# Patient Record
Sex: Female | Born: 1990 | Race: Black or African American | Hispanic: No | Marital: Single | Smoking: Former smoker
Health system: Southern US, Community
[De-identification: ages and names within clinical notes are randomized; demographics above are authoritative.]

## PROBLEM LIST (undated history)

## (undated) ENCOUNTER — Inpatient Hospital Stay (HOSPITAL_COMMUNITY): Payer: Self-pay

## (undated) DIAGNOSIS — O133 Gestational [pregnancy-induced] hypertension without significant proteinuria, third trimester: Secondary | ICD-10-CM

## (undated) DIAGNOSIS — O139 Gestational [pregnancy-induced] hypertension without significant proteinuria, unspecified trimester: Secondary | ICD-10-CM

## (undated) DIAGNOSIS — O163 Unspecified maternal hypertension, third trimester: Secondary | ICD-10-CM

## (undated) DIAGNOSIS — R079 Chest pain, unspecified: Secondary | ICD-10-CM

## (undated) DIAGNOSIS — R011 Cardiac murmur, unspecified: Secondary | ICD-10-CM

## (undated) HISTORY — DX: Chest pain, unspecified: R07.9

## (undated) HISTORY — DX: Cardiac murmur, unspecified: R01.1

## (undated) HISTORY — PX: INDUCED ABORTION: SHX677

---
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
## 2008-06-09 ENCOUNTER — Inpatient Hospital Stay (HOSPITAL_COMMUNITY): Admission: AD | Admit: 2008-06-09 | Discharge: 2008-06-09 | Payer: Self-pay | Admitting: Obstetrics & Gynecology

## 2008-07-04 ENCOUNTER — Inpatient Hospital Stay (HOSPITAL_COMMUNITY): Admission: AD | Admit: 2008-07-04 | Discharge: 2008-07-04 | Payer: Self-pay | Admitting: Family Medicine

## 2008-08-06 ENCOUNTER — Ambulatory Visit (HOSPITAL_COMMUNITY): Admission: RE | Admit: 2008-08-06 | Discharge: 2008-08-06 | Payer: Self-pay | Admitting: Obstetrics & Gynecology

## 2008-08-09 ENCOUNTER — Inpatient Hospital Stay (HOSPITAL_COMMUNITY): Admission: AD | Admit: 2008-08-09 | Discharge: 2008-08-09 | Payer: Self-pay | Admitting: Obstetrics & Gynecology

## 2008-11-18 ENCOUNTER — Inpatient Hospital Stay (HOSPITAL_COMMUNITY): Admission: AD | Admit: 2008-11-18 | Discharge: 2008-11-18 | Payer: Self-pay | Admitting: Obstetrics & Gynecology

## 2009-01-03 ENCOUNTER — Inpatient Hospital Stay (HOSPITAL_COMMUNITY): Admission: AD | Admit: 2009-01-03 | Discharge: 2009-01-05 | Payer: Self-pay | Admitting: Obstetrics

## 2009-09-09 IMAGING — US US OB COMP LESS 14 WK
1 series · 14 of 28 positions shown · non-contrast
Comparison: none

OBSTETRICAL ULTRASOUND:
 This ultrasound exam was performed in the [HOSPITAL] Ultrasound Department.  The OB US report was generated in the AS system, and faxed to the ordering physician.  This report is also available in [REDACTED] PACS.

[Series 1: us ob comp less 14 wk · 0.25mm/px · 14 of 56 slices shown]
[im 3/56]
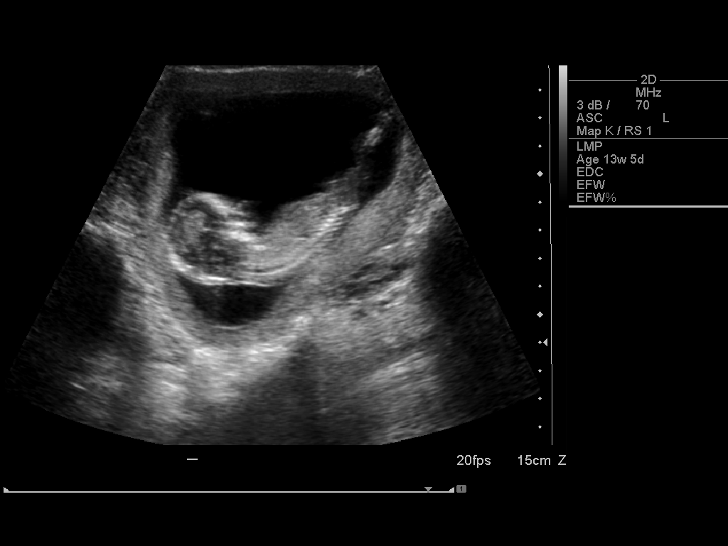
[im 7/56]
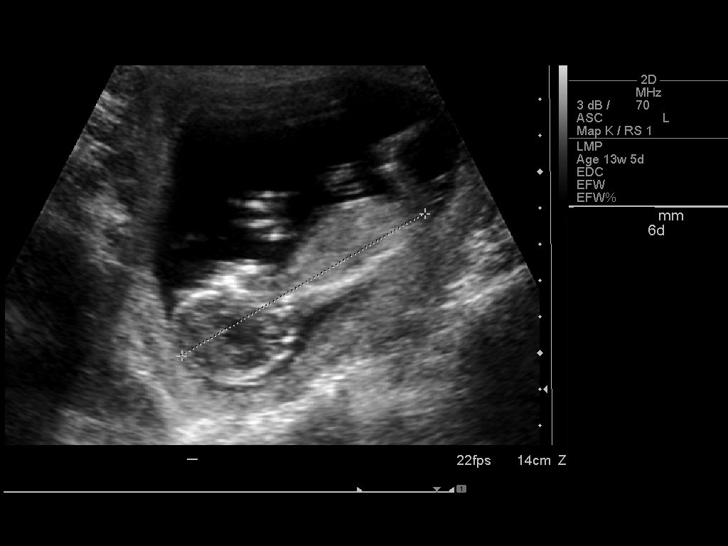
[im 11/56]
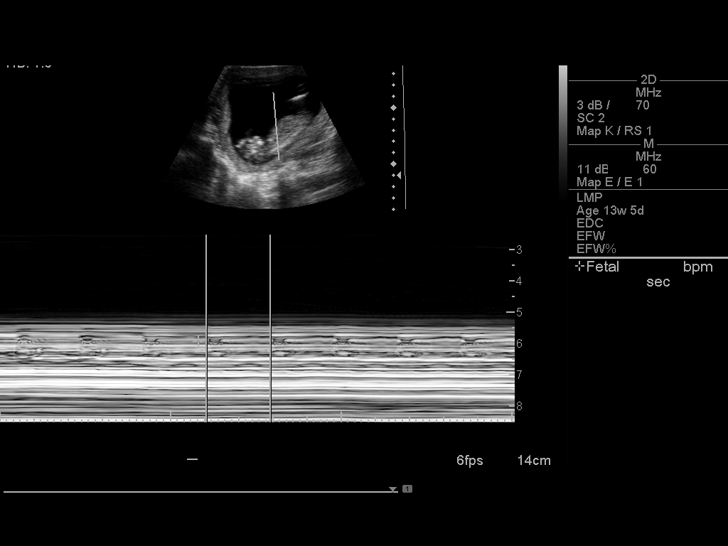
[im 15/56]
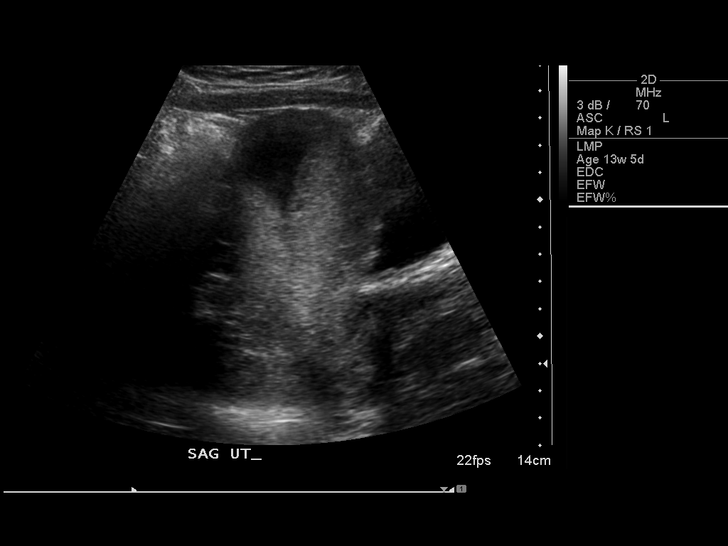
[im 19/56]
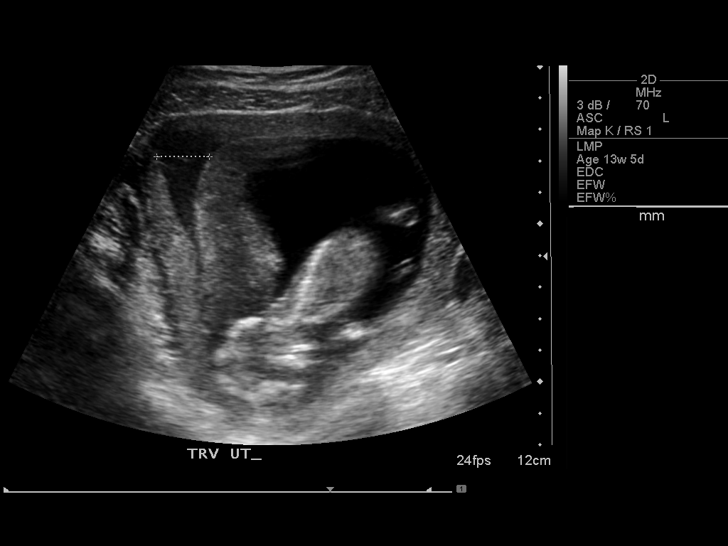
[im 23/56]
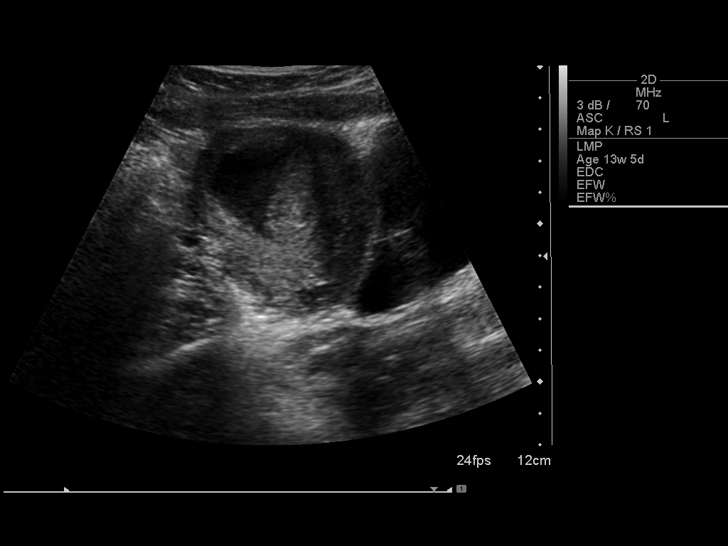
[im 27/56]
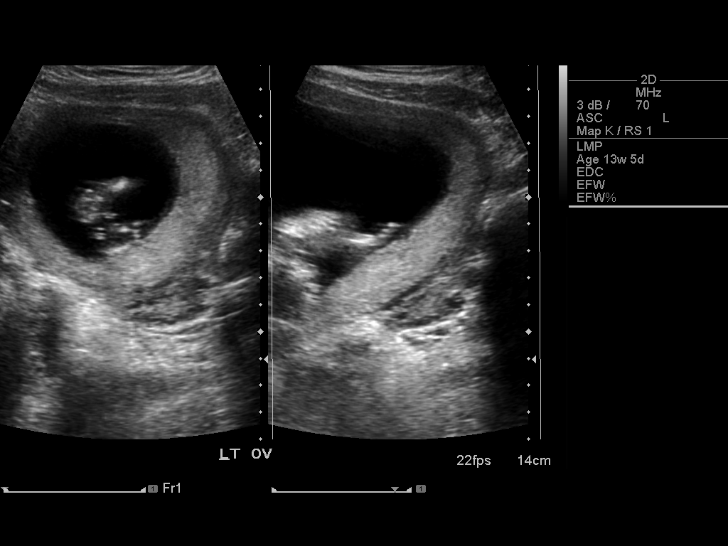
[im 31/56]
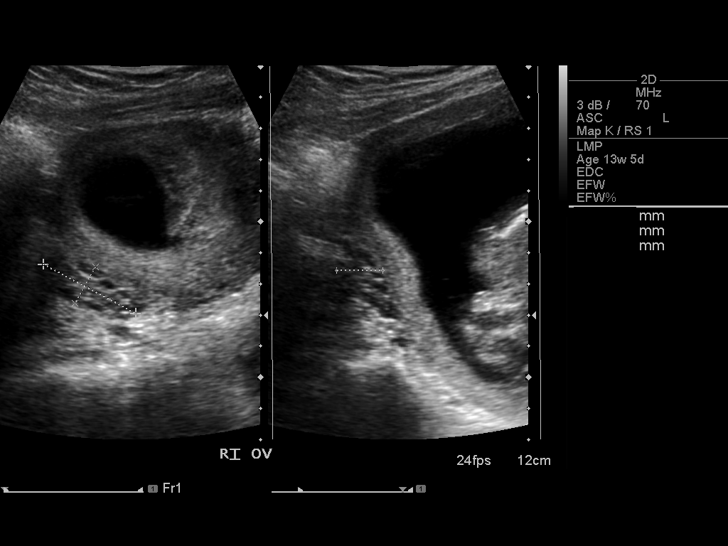
[im 35/56]
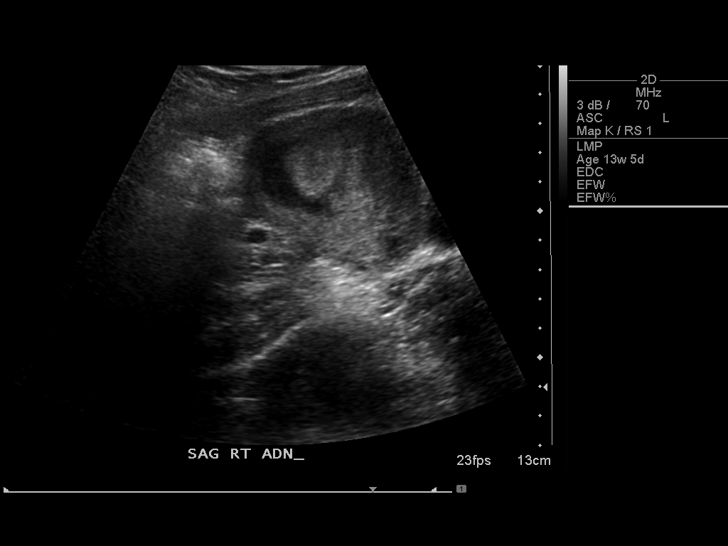
[im 39/56]
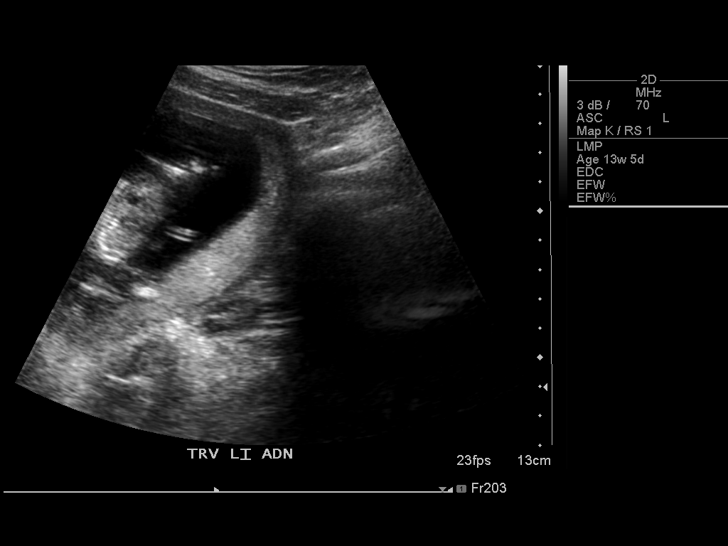
[im 43/56]
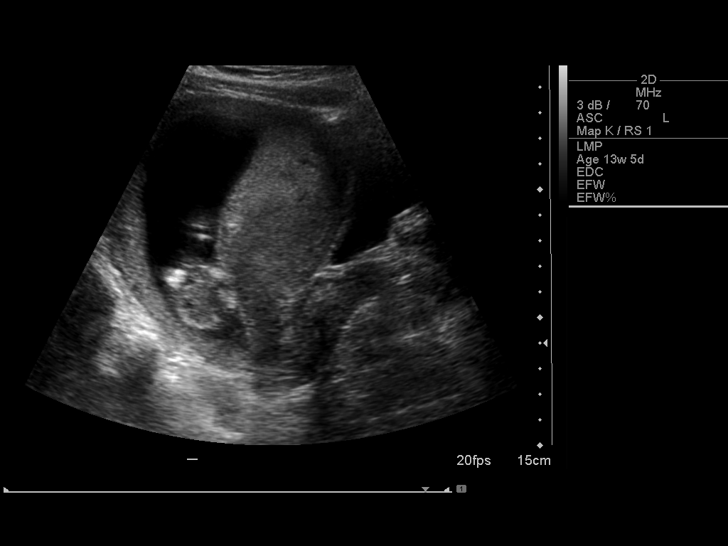
[im 47/56]
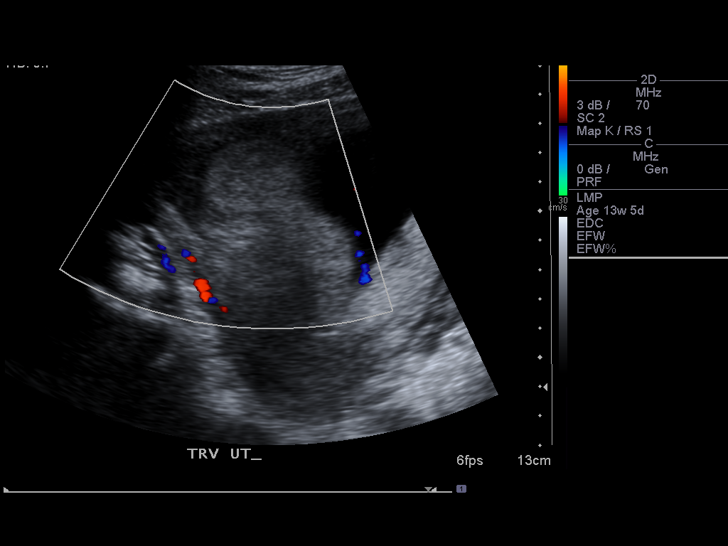
[im 51/56]
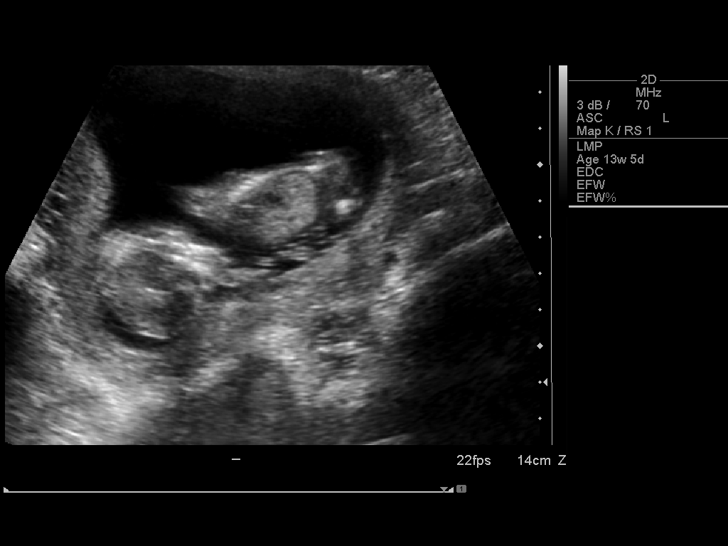
[im 56/56]
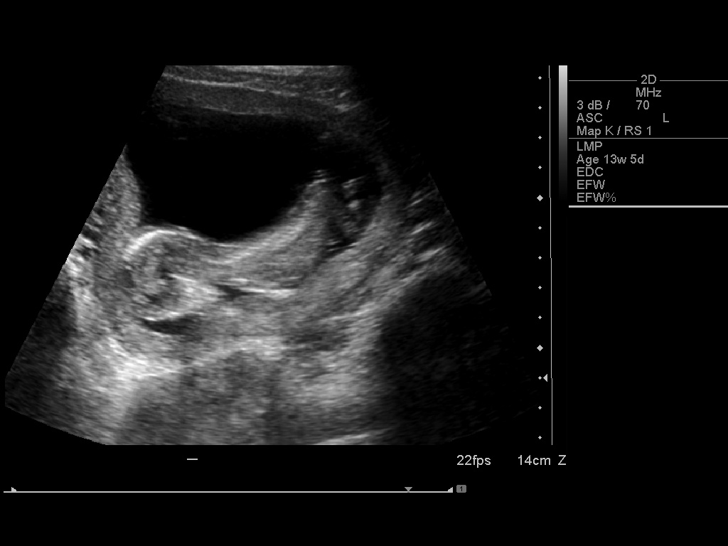

[14 of 28 positions shown; findings below may reference images not displayed]

IMPRESSION: See [facility] Obstetric US report.

## 2010-12-28 ENCOUNTER — Ambulatory Visit (HOSPITAL_COMMUNITY): Admission: RE | Admit: 2010-12-28 | Payer: Self-pay | Source: Home / Self Care | Admitting: Obstetrics & Gynecology

## 2011-02-25 ENCOUNTER — Inpatient Hospital Stay (HOSPITAL_COMMUNITY)
Admission: AD | Admit: 2011-02-25 | Discharge: 2011-02-25 | Disposition: A | Discharge status: Home / Self Care | Payer: 59 | Source: Ambulatory Visit | Attending: Obstetrics | Admitting: Obstetrics

## 2011-02-25 ENCOUNTER — Inpatient Hospital Stay (HOSPITAL_COMMUNITY): Payer: 59

## 2011-02-25 DIAGNOSIS — O98819 Other maternal infectious and parasitic diseases complicating pregnancy, unspecified trimester: Secondary | ICD-10-CM

## 2011-02-25 DIAGNOSIS — O4690 Antepartum hemorrhage, unspecified, unspecified trimester: Secondary | ICD-10-CM | POA: Insufficient documentation

## 2011-02-25 DIAGNOSIS — A5901 Trichomonal vulvovaginitis: Secondary | ICD-10-CM | POA: Insufficient documentation

## 2011-02-25 LAB — WET PREP, GENITAL: Clue Cells Wet Prep HPF POC: NONE SEEN

## 2011-02-25 LAB — CBC
HCT: 25.2 % — ABNORMAL LOW (ref 36.0–46.0)
MCV: 83.7 fL (ref 78.0–100.0)
Platelets: 248 10*3/uL (ref 150–400)
RBC: 3.01 MIL/uL — ABNORMAL LOW (ref 3.87–5.11)
WBC: 12.6 10*3/uL — ABNORMAL HIGH (ref 4.0–10.5)

## 2011-02-27 LAB — GC/CHLAMYDIA PROBE AMP, GENITAL: Chlamydia, DNA Probe: NEGATIVE

## 2011-03-20 LAB — CBC
HCT: 30.9 % — ABNORMAL LOW (ref 36.0–46.0)
MCHC: 33.3 g/dL (ref 30.0–36.0)
MCHC: 34 g/dL (ref 30.0–36.0)
MCV: 89.3 fL (ref 78.0–100.0)
Platelets: 201 10*3/uL (ref 150–400)
Platelets: 237 10*3/uL (ref 150–400)
RBC: 2.8 MIL/uL — ABNORMAL LOW (ref 3.87–5.11)
RDW: 13.6 % (ref 11.5–15.5)
RDW: 13.9 % (ref 11.5–15.5)

## 2011-04-04 ENCOUNTER — Inpatient Hospital Stay (HOSPITAL_COMMUNITY)
Admission: AD | Admit: 2011-04-04 | Discharge: 2011-04-05 | Disposition: A | Discharge status: Home / Self Care | Payer: 59 | Source: Ambulatory Visit | Attending: Obstetrics & Gynecology | Admitting: Obstetrics & Gynecology

## 2011-04-04 DIAGNOSIS — O479 False labor, unspecified: Secondary | ICD-10-CM | POA: Insufficient documentation

## 2011-04-07 ENCOUNTER — Inpatient Hospital Stay (HOSPITAL_COMMUNITY)
Admission: AD | Admit: 2011-04-07 | Discharge: 2011-04-10 | DRG: 775 | Disposition: A | Discharge status: Home / Self Care | Payer: 59 | Source: Ambulatory Visit | Attending: Obstetrics & Gynecology | Admitting: Obstetrics & Gynecology

## 2011-04-07 DIAGNOSIS — Z22330 Carrier of Group B streptococcus: Secondary | ICD-10-CM

## 2011-04-07 DIAGNOSIS — O99892 Other specified diseases and conditions complicating childbirth: Secondary | ICD-10-CM | POA: Diagnosis present

## 2011-04-07 DIAGNOSIS — O4290 Premature rupture of membranes, unspecified as to length of time between rupture and onset of labor, unspecified weeks of gestation: Principal | ICD-10-CM | POA: Diagnosis present

## 2011-04-07 LAB — CBC
MCV: 79.5 fL (ref 78.0–100.0)
Platelets: 197 10*3/uL (ref 150–400)
RBC: 3.75 MIL/uL — ABNORMAL LOW (ref 3.87–5.11)
RDW: 15.4 % (ref 11.5–15.5)
WBC: 14.6 10*3/uL — ABNORMAL HIGH (ref 4.0–10.5)

## 2011-04-08 LAB — RPR: RPR Ser Ql: NONREACTIVE

## 2011-04-09 LAB — CBC
Hemoglobin: 8.3 g/dL — ABNORMAL LOW (ref 12.0–15.0)
Platelets: 186 10*3/uL (ref 150–400)
RBC: 3.29 MIL/uL — ABNORMAL LOW (ref 3.87–5.11)
WBC: 17.4 10*3/uL — ABNORMAL HIGH (ref 4.0–10.5)

## 2011-08-30 LAB — URINALYSIS, ROUTINE W REFLEX MICROSCOPIC
Hgb urine dipstick: NEGATIVE
Nitrite: NEGATIVE
Protein, ur: NEGATIVE
Specific Gravity, Urine: 1.01
Urobilinogen, UA: 1

## 2011-08-30 LAB — POCT PREGNANCY, URINE
Operator id: 118971
Preg Test, Ur: POSITIVE

## 2011-08-30 LAB — URINE MICROSCOPIC-ADD ON

## 2011-08-30 LAB — WET PREP, GENITAL
Clue Cells Wet Prep HPF POC: NONE SEEN
Yeast Wet Prep HPF POC: NONE SEEN

## 2011-08-30 LAB — GC/CHLAMYDIA PROBE AMP, GENITAL: Chlamydia, DNA Probe: NEGATIVE

## 2011-08-31 LAB — URINE CULTURE: Colony Count: NO GROWTH

## 2011-08-31 LAB — URINALYSIS, ROUTINE W REFLEX MICROSCOPIC
Bilirubin Urine: NEGATIVE
Ketones, ur: NEGATIVE
Nitrite: NEGATIVE
Specific Gravity, Urine: 1.01
Urobilinogen, UA: 0.2
pH: 7.5

## 2011-09-05 LAB — BASIC METABOLIC PANEL
Calcium: 9.5
Creatinine, Ser: 0.39 — ABNORMAL LOW
GFR calc Af Amer: >60
GFR calc non Af Amer: >60
Sodium: 134 — ABNORMAL LOW

## 2011-09-05 LAB — CBC
Hemoglobin: 10.9 — ABNORMAL LOW
RBC: 3.66 — ABNORMAL LOW
WBC: 11.8 — ABNORMAL HIGH

## 2011-09-07 LAB — URINE MICROSCOPIC-ADD ON

## 2011-09-07 LAB — URINALYSIS, ROUTINE W REFLEX MICROSCOPIC
Glucose, UA: NEGATIVE mg/dL
Protein, ur: NEGATIVE mg/dL
Specific Gravity, Urine: <1.005 — ABNORMAL LOW (ref 1.005–1.030)
pH: 7 (ref 5.0–8.0)

## 2011-09-07 LAB — GC/CHLAMYDIA PROBE AMP, GENITAL
Chlamydia, DNA Probe: NEGATIVE
GC Probe Amp, Genital: NEGATIVE

## 2011-09-07 LAB — WET PREP, GENITAL: Clue Cells Wet Prep HPF POC: NONE SEEN

## 2013-03-04 ENCOUNTER — Ambulatory Visit: Payer: 59

## 2013-03-04 ENCOUNTER — Ambulatory Visit (INDEPENDENT_AMBULATORY_CARE_PROVIDER_SITE_OTHER): Payer: 59 | Admitting: Family Medicine

## 2013-03-04 VITALS — BP 130/87 | HR 105 | Temp 99.4°F | Resp 16 | Ht 64.0 in | Wt 113.2 lb

## 2013-03-04 DIAGNOSIS — M25532 Pain in left wrist: Secondary | ICD-10-CM

## 2013-03-04 DIAGNOSIS — M79642 Pain in left hand: Secondary | ICD-10-CM

## 2013-03-04 DIAGNOSIS — M25539 Pain in unspecified wrist: Secondary | ICD-10-CM

## 2013-03-04 DIAGNOSIS — S63502A Unspecified sprain of left wrist, initial encounter: Secondary | ICD-10-CM

## 2013-03-04 DIAGNOSIS — M79609 Pain in unspecified limb: Secondary | ICD-10-CM

## 2013-03-04 DIAGNOSIS — S63509A Unspecified sprain of unspecified wrist, initial encounter: Secondary | ICD-10-CM
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
## 2013-03-04 NOTE — Patient Instructions (Addendum)
Wear the splint as discussed. Ice to area up to 15 minutes at a time as needed. Ibuprofen over the counter is ok to use, but call if stronger medicine needed. Recheck next Thursday 03/12/13 with Dr. Neva Seat after 4pm.  See the handout below, but can wait on actual exercises until recheck. Return to the clinic or go to the nearest emergency room if any of your symptoms worsen or new symptoms occur.  Here is the number for [facility] of the [place] for counseling if you would like. Main number: [phone] 24 hour crisis line: [phone]   Wrist Sprain with Rehab A sprain is an injury in which a ligament that maintains the proper alignment of a joint is partially or completely torn. The ligaments of the wrist are susceptible to sprains. Sprains are classified into three categories. Grade 1 sprains cause pain, but the tendon is not lengthened. Grade 2 sprains include a lengthened ligament because the ligament is stretched or partially ruptured. With grade 2 sprains there is still function, although the function may be diminished. Grade 3 sprains are characterized by a complete tear of the tendon or muscle, and function is usually impaired. SYMPTOMS   Pain tenderness, inflammation, and/or bruising (contusion) of the injury.  A "pop" or tear felt and/or heard at the time of injury.  Decreased wrist function. CAUSES  A wrist sprain occurs when a force is placed on one or more ligaments that is greater than it/they can withstand. Common mechanisms of injury include:  Catching a ball with you hands.  Repetitive and/ or strenuous extension or flexion of the wrist. RISK INCREASES WITH:  Previous wrist injury.  Contact sports (boxing or wrestling).  Activities in which falling is common.  Poor strength and flexibility.  Improperly fitted or padded protective equipment. PREVENTION  Warm up and stretch properly before activity.  Allow for adequate recovery between workouts.  Maintain  physical fitness:  Strength, flexibility, and endurance.  Cardiovascular fitness.  Protect the wrist joint by limiting its motion with the use of taping, braces, or splints.  Protect the wrist after injury for 6 to 12 months. PROGNOSIS  The prognosis for wrist sprains depends on the degree of injury. Grade 1 sprains require 2 to 6 weeks of treatment. Grade 2 sprains require 6 to 8 weeks of treatment, and grade 3 sprains require up to 12 weeks.  RELATED COMPLICATIONS   Prolonged healing time, if improperly treated or re-injured.  Recurrent symptoms that result in a chronic problem.  Injury to nearby structures (bone, cartilage, nerves, or tendons).  Arthritis of the wrist.  Inability to compete in athletics at a high level.  Wrist stiffness or weakness.  Progression to a complete rupture of the ligament. TREATMENT  Treatment initially involves resting from any activities that aggravate the symptoms, and the use of ice and medications to help reduce pain and inflammation. Your caregiver may recommend immobilizing the wrist for a period of time in order to reduce stress on the ligament and allow for healing. After immobilization it is important to perform strengthening and stretching exercises to help regain strength and a full range of motion. These exercises may be completed at home or with a therapist. Surgery is not usually required for wrist sprains, unless the ligament has been ruptured (grade 3 sprain). MEDICATION   If pain medication is necessary, then nonsteroidal anti-inflammatory medications, such as aspirin and ibuprofen, or other minor pain relievers, such as acetaminophen, are often recommended.  Do not take pain  medication for 7 days before surgery.  Prescription pain relievers may be given if deemed necessary by your caregiver. Use only as directed and only as much as you need. HEAT AND COLD  Cold treatment (icing) relieves pain and reduces inflammation. Cold  treatment should be applied for 10 to 15 minutes every 2 to 3 hours for inflammation and pain and immediately after any activity that aggravates your symptoms. Use ice packs or massage the area with a piece of ice (ice massage).  Heat treatment may be used prior to performing the stretching and strengthening activities prescribed by your caregiver, physical therapist, or athletic trainer. Use a heat pack or soak your injury in warm water. SEEK MEDICAL CARE IF:  Treatment seems to offer no benefit, or the condition worsens.  Any medications produce adverse side effects. EXERCISES RANGE OF MOTION (ROM) AND STRETCHING EXERCISES - Wrist Sprain  These exercises may help you when beginning to rehabilitate your injury. Your symptoms may resolve with or without further involvement from your physician, physical therapist or athletic trainer. While completing these exercises, remember:   Restoring tissue flexibility helps normal motion to return to the joints. This allows healthier, less painful movement and activity.  An effective stretch should be held for at least 30 seconds.  A stretch should never be painful. You should only feel a gentle lengthening or release in the stretched tissue. RANGE OF MOTION  Wrist Flexion, Active-Assisted  Extend your right / left elbow with your fingers pointing down.*  Gently pull the back of your hand towards you until you feel a gentle stretch on the top of your forearm.  Hold this position for __________ seconds. Repeat __________ times. Complete this exercise __________ times per day.  *If directed by your physician, physical therapist or athletic trainer, complete this stretch with your elbow bent rather than extended. RANGE OF MOTION  Wrist Extension, Active-Assisted  Extend your right / left elbow and turn your palm upwards.*  Gently pull your palm/fingertips back so your wrist extends and your fingers point more toward the ground.  You should feel a  gentle stretch on the inside of your forearm.  Hold this position for __________ seconds. Repeat __________ times. Complete this exercise __________ times per day. *If directed by your physician, physical therapist or athletic trainer, complete this stretch with your elbow bent, rather than extended. RANGE OF MOTION  Supination, Active  Stand or sit with your elbows at your side. Bend your right / left elbow to 90 degrees.  Turn your palm upward until you feel a gentle stretch on the inside of your forearm.  Hold this position for __________ seconds. Slowly release and return to the starting position. Repeat __________ times. Complete this stretch __________ times per day.  RANGE OF MOTION  Pronation, Active  Stand or sit with your elbows at your side. Bend your right / left elbow to 90 degrees.  Turn your palm downward until you feel a gentle stretch on the top of your forearm.  Hold this position for __________ seconds. Slowly release and return to the starting position. Repeat __________ times. Complete this stretch __________ times per day.  STRETCH - Wrist Flexion  Place the back of your right / left hand on a tabletop leaving your elbow slightly bent. Your fingers should point away from your body.  Gently press the back of your hand down onto the table by straightening your elbow. You should feel a stretch on the top of your forearm.  Hold this position for __________ seconds. Repeat __________ times. Complete this stretch __________ times per day.  STRETCH  Wrist Extension  Place your right / left fingertips on a tabletop leaving your elbow slightly bent. Your fingers should point backwards.  Gently press your fingers and palm down onto the table by straightening your elbow. You should feel a stretch on the inside of your forearm.  Hold this position for __________ seconds. Repeat __________ times. Complete this stretch __________ times per day.  STRENGTHENING EXERCISES -  Wrist Sprain These exercises may help you when beginning to rehabilitate your injury. They may resolve your symptoms with or without further involvement from your physician, physical therapist or athletic trainer. While completing these exercises, remember:   Muscles can gain both the endurance and the strength needed for everyday activities through controlled exercises.  Complete these exercises as instructed by your physician, physical therapist or athletic trainer. Progress with the resistance and repetition exercises only as your caregiver advises. STRENGTH Wrist Flexors  Sit with your right / left forearm palm-up and fully supported. Your elbow should be resting below the height of your shoulder. Allow your wrist to extend over the edge of the surface.  Loosely holding a __________ weight or a piece of rubber exercise band/tubing, slowly curl your hand up toward your forearm.  Hold this position for __________ seconds. Slowly lower the wrist back to the starting position in a controlled manner. Repeat __________ times. Complete this exercise __________ times per day.  STRENGTH  Wrist Extensors  Sit with your right / left forearm palm-down and fully supported. Your elbow should be resting below the height of your shoulder. Allow your wrist to extend over the edge of the surface.  Loosely holding a __________ weight or a piece of rubber exercise band/tubing, slowly curl your hand up toward your forearm.  Hold this position for __________ seconds. Slowly lower the wrist back to the starting position in a controlled manner. Repeat __________ times. Complete this exercise __________ times per day.  STRENGTH - Ulnar Deviators  Stand with a ____________________ weight in your right / left hand, or sit holding on to the rubber exercise band/tubing with your opposite arm supported.  Move your wrist so that your pinkie travels toward your forearm and your thumb moves away from your  forearm.  Hold this position for __________ seconds and then slowly lower the wrist back to the starting position. Repeat __________ times. Complete this exercise __________ times per day STRENGTH - Radial Deviators  Stand with a ____________________ weight in your  right / left hand, or sit holding on to the rubber exercise band/tubing with your arm supported.  Raise your hand upward in front of you or pull up on the rubber tubing.  Hold this position for __________ seconds and then slowly lower the wrist back to the starting position. Repeat __________ times. Complete this exercise __________ times per day. STRENGTH  Forearm Supinators  Sit with your right / left forearm supported on a table, keeping your elbow below shoulder height. Rest your hand over the edge, palm down.  Gently grip a hammer or a soup ladle.  Without moving your elbow, slowly turn your palm and hand upward to a "thumbs-up" position.  Hold this position for __________ seconds. Slowly return to the starting position. Repeat __________ times. Complete this exercise __________ times per day.  STRENGTH  Forearm Pronators  Sit with your right / left forearm supported on a table, keeping your elbow below  shoulder height. Rest your hand over the edge, palm up.  Gently grip a hammer or a soup ladle.  Without moving your elbow, slowly turn your palm and hand upward to a "thumbs-up" position.  Hold this position for __________ seconds. Slowly return to the starting position. Repeat __________ times. Complete this exercise __________ times per day.  STRENGTH - Grip  Grasp a tennis ball, a dense sponge, or a large, rolled sock in your hand.  Squeeze as hard as you can without increasing any pain.  Hold this position for __________ seconds. Release your grip slowly. Repeat __________ times. Complete this exercise __________ times per day.  Document Released: 11/19/2005 Document Revised: 02/11/2012 Document Reviewed:  03/03/2009 [facility] Patient Information 2013 [place], [place].

## 2013-06-03 ENCOUNTER — Ambulatory Visit: Payer: 59

## 2013-06-03 ENCOUNTER — Ambulatory Visit (INDEPENDENT_AMBULATORY_CARE_PROVIDER_SITE_OTHER): Payer: 59 | Admitting: Family Medicine

## 2013-06-03 VITALS — BP 124/72 | HR 78 | Temp 98.8°F | Resp 18 | Ht 63.5 in | Wt 111.0 lb

## 2013-06-03 DIAGNOSIS — R079 Chest pain, unspecified: Secondary | ICD-10-CM

## 2013-06-03 DIAGNOSIS — R000 Tachycardia, unspecified: Secondary | ICD-10-CM

## 2013-06-03 DIAGNOSIS — R0602 Shortness of breath: Secondary | ICD-10-CM

## 2013-06-03 LAB — POCT CBC
HCT, POC: 39.8 % (ref 37.7–47.9)
Lymph, poc: 3.9 — AB (ref 0.6–3.4)
MCH, POC: 29.6 pg (ref 27–31.2)
MCHC: 31.7 g/dL — AB (ref 31.8–35.4)
MCV: 93.6 fL (ref 80–97)
MID (cbc): 0.6 (ref 0–0.9)
POC LYMPH PERCENT: 35.7 %L (ref 10–50)
Platelet Count, POC: 229 10*3/uL (ref 142–424)
RDW, POC: 13 %
WBC: 10.9 10*3/uL — AB (ref 4.6–10.2)

## 2013-06-03 NOTE — Progress Notes (Signed)
[facility] [address] [place] [address] [phone]  Date:  06/03/2013   Name:  Debbie Wood   DOB:  04-13-1991   MRN:  [id number]  PCP:  No PCP Per Patient    Chief Complaint: Chest Pain   History of Present Illness:  Debbie Wood is a 22 y.o. very pleasant female patient who presents with the following:  For about 9 days she has noted "my heart beating out of my chest."  Her heartbeats feel too fast and too hard.   She does note that the left side of her chest hurts, and she has pain into her back on the left side as well She also has noted some SOB.  She has been waking up at night feeling "like I can't breathe" and will note that her heart is pounding.   She does not do any strenuous exercise.  However, she has not noted any correlation between exertion and her sx.    She generally has a cough- she is a smoker- this is not worse.   She smokes about 3 MJ cigarettes a day. This has not changed as of late. No other drugs, no cocaine. No history of DVT/ PE.  No unusual family cardiac history- her parents have HTN but not CAD.   No recent travel. No hemoptysis.  She takes tri- sprintec OCP.    She was noted to have a heart murmur in her teens, she had an echocardiogram and was then allowed unrestricted activity.  Echo 2004: SUMMARY - Overall left ventricular systolic function was normal. Left ventricular ejection fraction was estimated , range being 55 % to 65 %. There were no left ventricular regional wall motion abnormalities. - The pulmonary veins were grossly normal. - Mild peripheral pulmonic stenosis. Peak transpulmonic valve gradient was 16 mmHg. Mean transpulmonic valve gradient was 9 mmHg. IMPRESSIONS - Very mild peripheral pulmonic stenosis-pulmonary valve normal. Otherwise normal study. - Disposition: Recheck in 2 years. No restrictions. --------------------------------------------------------------- Prepared and Electronically  Authenticated by Elsie Stain M.D. Confirmed 30-Sep-2003 16:04:31  She never did have a recheck echo.  She was seen by a pediatric cardiologist here in [place]  She does admit that she has been very stressed lately because her 23 year old son has been recently diagnosed with possible autism.  He is still undergoing evaluation, and has a care plan in place.  She feels better now that there is a plan but is still quite apprehensive and worried about him.   There are no active problems to display for this patient.   Past Medical History  Diagnosis Date  . Heart murmur     History reviewed. No pertinent past surgical history.  History  Substance Use Topics  . Smoking status: Former Smoker    Types: Cigarettes    Quit date: 06/03/2009  . Smokeless tobacco: Not on file  . Alcohol Use: No    Family History  Problem Relation Age of Onset  . Hypertension Mother   . Hyperlipidemia Mother   . Hypertension Father     No Known Allergies  Medication list has been reviewed and updated.  Current Outpatient Prescriptions on File Prior to Visit  Medication Sig Dispense Refill  . norgestrel-ethinyl estradiol (LO/OVRAL,CRYSELLE) 0.3-30 MG-MCG tablet Take 1 tablet by mouth daily.       No current facility-administered medications on file prior to visit.    Review of Systems:  As per HPI- otherwise negative.   Physical  Examination: Filed Vitals:   06/03/13 1619  BP: 124/72  Pulse: 116  Temp: 98.8 F (37.1 C)  Resp: 18   Filed Vitals:   06/03/13 1619  Height: 5' 3.5" (1.613 m)  Weight: 111 lb (50.349 kg)   Body mass index is 19.35 kg/(m^2). Ideal Body Weight: Weight in (lb) to have BMI = 25: 143.1  GEN: WDWN, NAD, Non-toxic, A & O x 3. Looks well HEENT: Atraumatic, Normocephalic. Neck supple. No masses, No LAD. Ears and Nose: No external deformity. CV: sinus tachycardia, No G/R. No JVD. No thrill. No extra heart sounds. She does have a systolic murmur, 2/6 I am  easily able to reproduce her CP by pressing on her left chest wall and on there left trapezius muscle/ upper back. No redness, abscess or lesion.    PULM: CTA B, no wheezes, crackles, rhonchi. No retractions. No resp. distress. No accessory muscle use. ABD: S, NT, ND, +BS. No rebound. No HSM. EXTR: No c/c/e, no calf swelling or tenderness  NEURO Normal gait.  PSYCH: Normally interactive. Conversant. Not depressed or anxious appearing.  Calm demeanor.   [facility] reading (PRIMARY) by  Dr. Patsy Lager. CXR:  Negative CHEST - 2 VIEW  Comparison: None.  Findings: Lungs are clear. Heart size is normal. No pneumothorax or pleural fluid.  IMPRESSION: Negative chest.  Clinically significant discrepancy from primary report, if provided: None  EKG: normal sinus rhythm, rate in the 70's.   Results for orders placed in visit on 06/03/13  D-DIMER, QUANTITATIVE      Result Value Range   D-Dimer, Quant 0.42  0.00 - 0.48 ug/mL-FEU  POCT CBC      Result Value Range   WBC 10.9 (*) 4.6 - 10.2 K/uL   Lymph, poc 3.9 (*) 0.6 - 3.4   POC LYMPH PERCENT 35.7  10 - 50 %L   MID (cbc) 0.6  0 - 0.9   POC MID % 5.5  0 - 12 %M   POC Granulocyte 6.4  2 - 6.9   Granulocyte percent 58.8  37 - 80 %G   RBC 4.25  4.04 - 5.48 M/uL   Hemoglobin 12.6  12.2 - 16.2 g/dL   HCT, POC 54.0  98.1 - 47.9 %   MCV 93.6  80 - 97 fL   MCH, POC 29.6  27 - 31.2 pg   MCHC 31.7 (*) 31.8 - 35.4 g/dL   RDW, POC 19.1     Platelet Count, POC 229  142 - 424 K/uL   MPV 8.5  0 - 99.8 fL    Assessment and Plan: Chest pain - Plan: DG Chest 2 View, EKG 12-Lead  SOB (shortness of breath) - Plan: D-dimer, quantitative  Tachycardia - Plan: POCT CBC, TSH  Discussed in detail with Nkechi.  Certainly we need to rule- out a PE.  Discussed doing a stat D dimer vs having her go to the ER.  Explained that it is also possible that she could have a cardiac issue.  CAD would be unlikely given her age, but it is possible.  Also, it is possible that  her pulmonary stenosis has gotten worse or that she has some other cardiac condition.  She could have further cardiac work- up at the ED.  Offered to arrange an ED evaluation for her.  At this time she declined to go to the ED, but did agree to a D dimer.  Assuming this is normal plan to obtain a repeat echo in the next  few days (asap).    Called around 8:30 pm with negative D dimer result.  She is relieved, and will plan to go for her echo soon.  If any problems/ getting worse overnight she will go to the ER.   Signed Abbe Amsterdam, MD

## 2013-06-04 ENCOUNTER — Ambulatory Visit (HOSPITAL_COMMUNITY)
Admission: RE | Admit: 2013-06-04 | Discharge: 2013-06-04 | Disposition: A | Discharge status: Home / Self Care | Payer: 59 | Source: Ambulatory Visit | Attending: Cardiovascular Disease | Admitting: Cardiovascular Disease

## 2013-06-04 DIAGNOSIS — R000 Tachycardia, unspecified: Secondary | ICD-10-CM

## 2013-06-04 DIAGNOSIS — R079 Chest pain, unspecified: Secondary | ICD-10-CM | POA: Insufficient documentation

## 2013-06-04 DIAGNOSIS — I359 Nonrheumatic aortic valve disorder, unspecified: Secondary | ICD-10-CM | POA: Insufficient documentation

## 2013-06-04 DIAGNOSIS — I059 Rheumatic mitral valve disease, unspecified: Secondary | ICD-10-CM | POA: Insufficient documentation

## 2013-06-04 DIAGNOSIS — R0602 Shortness of breath: Secondary | ICD-10-CM

## 2013-06-04 DIAGNOSIS — I079 Rheumatic tricuspid valve disease, unspecified: Secondary | ICD-10-CM | POA: Insufficient documentation

## 2013-06-04 NOTE — Progress Notes (Signed)
2D Echo Performed 06/04/2013    Anaisabel Pederson, RCS  

## 2013-06-05 ENCOUNTER — Telehealth: Payer: Self-pay | Admitting: Family Medicine

## 2013-06-05 ENCOUNTER — Ambulatory Visit: Payer: 59

## 2013-06-05 DIAGNOSIS — R002 Palpitations: Secondary | ICD-10-CM

## 2013-06-05 NOTE — Telephone Encounter (Signed)
Called her to go over her echo results from yesterday.    Study Conclusions  - Left ventricle: The cavity size was normal. Wall thickness was normal. Systolic function was normal. The estimated ejection fraction was in the range of 55% to 60%. Wall motion was normal; there were no regional wall motion abnormalities. Left ventricular diastolic function parameters were normal. - Aortic valve: Structurally normal valve. Trileaflet. Transvalvular velocity was within the normal range. There was no stenosis. No regurgitation. 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 Mild regurgitation. - Left atrium: LA volume/ BSA = 10 ml/m2 The atrium was normal in size. - Inferior vena cava: The vessel was normal in size; the respirophasic diameter changes were in the normal range (= 50%); findings are consistent with normal central venous pressure. - Pericardium, extracardiac: A trivial pericardial effusion was identified. Transthoracic echocardiography. M-mode, complete 2D, spectral Doppler, and color Doppler. Height: Height: 160cm. Height: 63in. Weight: Weight: 50.3kg. Weight: 110.8lb. Body mass index: BMI: 19.7kg/m^2. Body surface area: BSA: 1.44m^2. Blood pressure: 98/60. Patient status: Outpatient. Location: Echo laboratory.  She doe shave some mitral valve thickening and regurgitation. Overall she is feeling better- CP is much better (still a little sore if she presses on her chest), and she continues to note some intermittent racing heart beat.   Had her come in for a pulse check.   Pulse 75- 80 BPB, BP 118/75, sat 99%.    Plan to have her see cardiology next week for further evaluation.  She is ok with this plan.  Will make referral.  Let me know if any problems in the meantime

## 2013-06-29 ENCOUNTER — Encounter: Payer: Self-pay | Admitting: *Deleted

## 2013-06-29 ENCOUNTER — Encounter: Payer: Self-pay | Admitting: Cardiology

## 2013-06-29 DIAGNOSIS — R0602 Shortness of breath: Secondary | ICD-10-CM | POA: Insufficient documentation

## 2013-06-29 DIAGNOSIS — R079 Chest pain, unspecified: Secondary | ICD-10-CM | POA: Insufficient documentation

## 2013-06-29 DIAGNOSIS — R011 Cardiac murmur, unspecified: Secondary | ICD-10-CM | POA: Insufficient documentation

## 2013-07-01 ENCOUNTER — Institutional Professional Consult (permissible substitution): Payer: 59 | Admitting: Cardiovascular Disease

## 2013-08-20 ENCOUNTER — Institutional Professional Consult (permissible substitution): Payer: 59 | Admitting: Cardiovascular Disease

## 2013-09-03 ENCOUNTER — Encounter: Payer: Self-pay | Admitting: Cardiovascular Disease

## 2014-01-26 ENCOUNTER — Ambulatory Visit (INDEPENDENT_AMBULATORY_CARE_PROVIDER_SITE_OTHER): Payer: 59 | Admitting: Physician Assistant

## 2014-01-26 VITALS — BP 112/70 | HR 90 | Temp 98.4°F | Resp 16 | Ht 63.0 in | Wt 111.2 lb

## 2014-01-26 DIAGNOSIS — K089 Disorder of teeth and supporting structures, unspecified: Secondary | ICD-10-CM

## 2014-01-26 DIAGNOSIS — K0889 Other specified disorders of teeth and supporting structures: Secondary | ICD-10-CM

## 2014-01-26 DIAGNOSIS — K029 Dental caries, unspecified: Secondary | ICD-10-CM

## 2014-01-26 MED ORDER — AMOXICILLIN 875 MG PO TABS: 875.0000 mg | ORAL_TABLET | Freq: Two times a day (BID) | ORAL | Status: DC

## 2014-01-26 MED ORDER — HYDROCODONE-ACETAMINOPHEN 5-325 MG PO TABS: 1.0000 | ORAL_TABLET | Freq: Four times a day (QID) | ORAL | Status: DC | PRN

## 2014-01-26 NOTE — Progress Notes (Signed)
   Subjective:    Patient ID: Debbie Wood, female    DOB: [birth year], 23 y.o.   MRN: [id number]  HPI 23 year old female presents for evaluation of dental pain that has been intermittently bothering her for the past month. Has hx of fillings and cavities on both sides and has lost several teeth on the bottom left.  Has not seen a dentist in 6 years due to a bad experience she had at the time.  Admits she likely will need to see one but has been putting it off.  Is able to open and close her mouth but has not had much appetite secondary to pain.  Denies fever, chills, nausea, vomiting, or swelling.   She is otherwise healthy with no other concerns today.  Works as a Conservation officer, naturecashier at [employer]    Review of Systems  Constitutional: Positive for appetite change (decreased appetite). Negative for fever and chills.  HENT: Positive for dental problem and sore throat (left side).   Gastrointestinal: Negative for nausea and vomiting.  Neurological: Negative for headaches.       Objective:   Physical Exam  Constitutional: She is oriented to person, place, and time. She appears well-developed and well-nourished.  HENT:  Head: Normocephalic and atraumatic.  Right Ear: External ear normal.  Left Ear: External ear normal.  Mouth/Throat: Oropharynx is clear and moist and mucous membranes are normal. No oral lesions. No trismus in the jaw. Abnormal dentition. Dental caries present. No dental abscesses.  Patient reports pain to palpation of teeth. No abscess formation or pain in gums.    Eyes: Conjunctivae are normal.  Neck: Normal range of motion.  Cardiovascular: Normal rate.   Pulmonary/Chest: Effort normal.  Neurological: She is alert and oriented to person, place, and time.  Psychiatric: She has a normal mood and affect. Her behavior is normal. Judgment and thought content normal.          Assessment & Plan:  Pain, dental - Plan: HYDROcodone-acetaminophen (NORCO) 5-325 MG per tablet  Dental  caries - Plan: amoxicillin (AMOXIL) 875 MG tablet  Will treat with amoxicillin 875 mg bid x 10 days to cover for possible early abscess.  Norco 5/325 mg q6-8hours prn pain.  Recommend ibuprofen 800 mg tid with food Need to schedule appointment with dentist as soon as possible. Provided information for local dentist

## 2014-01-26 NOTE — Patient Instructions (Addendum)
[facility]  [address], [place], [place] [phone]     Dental Pain A tooth ache may be caused by cavities (tooth decay). Cavities expose the nerve of the tooth to air and hot or cold temperatures. It may come from an infection or abscess (also called a boil or furuncle) around your tooth. It is also often caused by dental caries (tooth decay). This causes the pain you are having. DIAGNOSIS  Your caregiver can diagnose this problem by exam. TREATMENT   If caused by an infection, it may be treated with medications which kill germs (antibiotics) and pain medications as prescribed by your caregiver. Take medications as directed.  Only take over-the-counter or prescription medicines for pain, discomfort, or fever as directed by your caregiver.  Whether the tooth ache today is caused by infection or dental disease, you should see your dentist as soon as possible for further care. SEEK MEDICAL CARE IF: The exam and treatment you received today has been provided on an emergency basis only. This is not a substitute for complete medical or dental care. If your problem worsens or new problems (symptoms) appear, and you are unable to meet with your dentist, call or return to this location. SEEK IMMEDIATE MEDICAL CARE IF:   You have a fever.  You develop redness and swelling of your face, jaw, or neck.  You are unable to open your mouth.  You have severe pain uncontrolled by pain medicine. MAKE SURE YOU:   Understand these instructions.  Will watch your condition.  Will get help right away if you are not doing well or get worse. Document Released: 11/19/2005 Document Revised: 02/11/2012 Document Reviewed: 07/07/2008 [facility] Patient Information 2014 [place], [place].

## 2014-05-21 ENCOUNTER — Ambulatory Visit (INDEPENDENT_AMBULATORY_CARE_PROVIDER_SITE_OTHER): Payer: 59 | Admitting: Family Medicine

## 2014-05-21 VITALS — BP 112/68 | HR 97 | Temp 98.3°F | Resp 18 | Ht 63.5 in | Wt 116.8 lb

## 2014-05-21 DIAGNOSIS — M6740 Ganglion, unspecified site: Secondary | ICD-10-CM

## 2014-05-21 DIAGNOSIS — M67432 Ganglion, left wrist: Secondary | ICD-10-CM

## 2014-05-21 DIAGNOSIS — M779 Enthesopathy, unspecified: Secondary | ICD-10-CM

## 2014-05-21 MED ORDER — PREDNISONE 20 MG PO TABS: ORAL_TABLET | ORAL | Status: DC

## 2014-05-21 NOTE — Patient Instructions (Signed)

## 2014-05-21 NOTE — Progress Notes (Signed)
23 yo woman working at [employer] with repetitive work routine and has developed left hand dorsal wrist pain around the ganglion cyst first diagnosed a year ago.  The recurrence has started 7-10 days ago with the new job.  She has an ortho appt next Wednesday.  Objective:  NAD Left hand has 3 mm nodule under long finger extensor tendon of left hand c/w ganglion cyst  FROM No STS  Assessment:  Tendonitis/ganglion cyst Tendonitis - Plan: predniSONE (DELTASONE) 20 MG tablet  Ganglion cyst of wrist, left  Signed, Elvina SidleKurt Granvil Djordjevic, MD

## 2014-08-03 ENCOUNTER — Emergency Department (HOSPITAL_COMMUNITY)
Admission: EM | Admit: 2014-08-03 | Discharge: 2014-08-03 | Disposition: A | Discharge status: Home / Self Care | Payer: 59 | Source: Home / Self Care | Attending: Family Medicine | Admitting: Family Medicine

## 2014-08-03 ENCOUNTER — Encounter (HOSPITAL_COMMUNITY): Payer: Self-pay | Admitting: Emergency Medicine

## 2014-08-03 DIAGNOSIS — K029 Dental caries, unspecified: Secondary | ICD-10-CM

## 2014-08-03 MED ORDER — DICLOFENAC POTASSIUM 50 MG PO TABS: 50.0000 mg | ORAL_TABLET | Freq: Three times a day (TID) | ORAL | Status: DC

## 2014-08-03 MED ORDER — PENICILLIN V POTASSIUM 500 MG PO TABS: 500.0000 mg | ORAL_TABLET | Freq: Three times a day (TID) | ORAL | Status: DC

## 2014-08-03 NOTE — ED Provider Notes (Signed)
Medical screening examination/treatment/procedure(s) were performed by resident physician or non-physician practitioner and as supervising physician I was immediately available for consultation/collaboration.   Barkley Bruns MD.   Linna Hoff, MD 08/03/14 1247

## 2014-08-03 NOTE — ED Provider Notes (Signed)
CSN: [id number]     Arrival date & time 08/03/14  8657 History   First MD Initiated Contact with Patient 08/03/14 [phone]     Chief Complaint  Patient presents with  . Dental Pain   (Consider location/radiation/quality/duration/timing/severity/associated sxs/prior Treatment) Patient is a 23 y.o. female presenting with tooth pain. The history is provided by the patient.  Dental Pain Location:  Lower Lower teeth location:  30/RL 1st molar, 31/RL 2nd molar and 32/RL 3rd molar Quality:  Aching Severity:  Moderate Onset quality:  Gradual Duration:  1 week Timing:  Constant Progression:  Worsening Chronicity:  New Context: poor dentition   Relieved by:  NSAIDs Associated symptoms: facial pain and facial swelling   Risk factors: smoking     Past Medical History  Diagnosis Date  . Heart murmur   . Chest pain   . SOB (shortness of breath)    History reviewed. No pertinent past surgical history. Family History  Problem Relation Age of Onset  . Hypertension Mother   . Hyperlipidemia Mother   . Hypertension Father    History  Substance Use Topics  . Smoking status: Former Smoker    Types: Cigarettes    Quit date: 06/03/2009  . Smokeless tobacco: Not on file  . Alcohol Use: No   OB History   Grav Para Term Preterm Abortions TAB SAB Ect Mult Living                 Review of Systems  HENT: Positive for facial swelling.   All other systems reviewed and are negative.   Allergies  Review of patient's allergies indicates no known allergies.  Home Medications   Prior to Admission medications   Medication Sig Start Date End Date Taking? Authorizing Provider  diclofenac (CATAFLAM) 50 MG tablet Take 1 tablet (50 mg total) by mouth 3 (three) times daily. As needed for pain 08/03/14   Ria Clock, PA  norgestrel-ethinyl estradiol (LO/OVRAL,CRYSELLE) 0.3-30 MG-MCG tablet Take 1 tablet by mouth daily.    Historical Provider, MD  penicillin v potassium (VEETID) 500 MG tablet  Take 1 tablet (500 mg total) by mouth 3 (three) times daily. X 7 days 08/03/14   Ria Clock, PA  predniSONE (DELTASONE) 20 MG tablet 2 daily with food 05/21/14   Elvina Sidle, MD   BP 130/92  Pulse 118  Temp(Src) 97.7 F (36.5 C) (Oral)  Resp 16  SpO2 100%  LMP 07/19/2014 Physical Exam  Nursing note and vitals reviewed. Constitutional: She is oriented to person, place, and time. She appears well-developed and well-nourished. No distress.  HENT:  Head: Normocephalic and atraumatic.  Eyes: Conjunctivae are normal.  Neck: Trachea normal, normal range of motion, full passive range of motion without pain and phonation normal. Neck supple.  Cardiovascular: Normal rate.   Pulmonary/Chest: Effort normal.  Musculoskeletal: Normal range of motion.  Lymphadenopathy:    She has no cervical adenopathy.  Neurological: She is alert and oriented to person, place, and time.  Skin: Skin is warm and dry.  Psychiatric: She has a normal mood and affect. Her behavior is normal.    ED Course  Procedures (including critical care time) Labs Review Labs Reviewed - No data to display  Imaging Review No results found.   MDM   1. Dental cavities    PCN VK and diclofenac as prescribed with dental follow up as patient has several large dental cavities that should be addressed.     Jess Barters H  [place], [place] 08/03/14 [phone]

## 2014-08-03 NOTE — Discharge Instructions (Signed)

## 2014-08-03 NOTE — ED Notes (Signed)
C/o toothache x 1 week, minimal relief w motrin

## 2015-02-26 ENCOUNTER — Emergency Department (HOSPITAL_COMMUNITY)
Admission: EM | Admit: 2015-02-26 | Discharge: 2015-02-26 | Disposition: A | Discharge status: Home / Self Care | Payer: 59 | Source: Home / Self Care | Attending: Emergency Medicine | Admitting: Emergency Medicine

## 2015-02-26 ENCOUNTER — Encounter (HOSPITAL_COMMUNITY): Payer: Self-pay | Admitting: Emergency Medicine

## 2015-02-26 DIAGNOSIS — J301 Allergic rhinitis due to pollen: Secondary | ICD-10-CM

## 2015-02-26 NOTE — Discharge Instructions (Signed)
Allergic Rhinitis Nondrowsy formula's for allergies include Allegra, Claritin and Zyrtec. For congestion may use Sudafed PE 10 mg Recommend Rhinocort or Flonase nasal spray May also use nasal saline spray frequently Drink lots of fluids stay well-hydrated  Allergic rhinitis is when the mucous membranes in the nose respond to allergens. Allergens are particles in the air that cause your body to have an allergic reaction. This causes you to release allergic antibodies. Through a chain of events, these eventually cause you to release histamine into the blood stream. Although meant to protect the body, it is this release of histamine that causes your discomfort, such as frequent sneezing, congestion, and an itchy, runny nose.  CAUSES  Seasonal allergic rhinitis (hay fever) is caused by pollen allergens that may come from grasses, trees, and weeds. Year-round allergic rhinitis (perennial allergic rhinitis) is caused by allergens such as house dust mites, pet dander, and mold spores.  SYMPTOMS   Nasal stuffiness (congestion).  Itchy, runny nose with sneezing and tearing of the eyes. DIAGNOSIS  Your health care provider can help you determine the allergen or allergens that trigger your symptoms. If you and your health care provider are unable to determine the allergen, skin or blood testing may be used. TREATMENT  Allergic rhinitis does not have a cure, but it can be controlled by:  Medicines and allergy shots (immunotherapy).  Avoiding the allergen. Hay fever may often be treated with antihistamines in pill or nasal spray forms. Antihistamines block the effects of histamine. There are over-the-counter medicines that may help with nasal congestion and swelling around the eyes. Check with your health care provider before taking or giving this medicine.  If avoiding the allergen or the medicine prescribed do not work, there are many new medicines your health care provider can prescribe. Stronger  medicine may be used if initial measures are ineffective. Desensitizing injections can be used if medicine and avoidance does not work. Desensitization is when a patient is given ongoing shots until the body becomes less sensitive to the allergen. Make sure you follow up with your health care provider if problems continue. HOME CARE INSTRUCTIONS It is not possible to completely avoid allergens, but you can reduce your symptoms by taking steps to limit your exposure to them. It helps to know exactly what you are allergic to so that you can avoid your specific triggers. SEEK MEDICAL CARE IF:   You have a fever.  You develop a cough that does not stop easily (persistent).  You have shortness of breath.  You start wheezing.  Symptoms interfere with normal daily activities. Document Released: 08/14/2001 Document Revised: 11/24/2013 Document Reviewed: 07/27/2013 [facility] Patient Information 2015 [place], [place]. This information is not intended to replace advice given to you by your health care provider. Make sure you discuss any questions you have with your health care provider.

## 2015-02-26 NOTE — ED Notes (Signed)
C/o  Productive cough with yellow sputum.  Nasal stuffiness.  Denies fever, n/v/d.   Chest congestion.  Pt has tried mucinex with no relief.  Symptoms present x 4 days.

## 2015-02-26 NOTE — ED Provider Notes (Signed)
CSN: [id number]     Arrival date & time 02/26/15  1426 History   First MD Initiated Contact with Patient 02/26/15 1506     Chief Complaint  Patient presents with  . Cough   (Consider location/radiation/quality/duration/timing/severity/associated sxs/prior Treatment) HPI Comments: 24 year old female complaining of a 4 day history of sneezing and a 2 day history of cough with yellow and green drainage with runny nose. She feels like she has anterior chest congestion. Minor scratchy/sore throat. She is also a smoker.   Past Medical History  Diagnosis Date  . Heart murmur   . Chest pain   . SOB (shortness of breath)    History reviewed. No pertinent past surgical history. Family History  Problem Relation Age of Onset  . Hypertension Mother   . Hyperlipidemia Mother   . Hypertension Father    History  Substance Use Topics  . Smoking status: Former Smoker    Types: Cigarettes    Quit date: 06/03/2009  . Smokeless tobacco: Not on file  . Alcohol Use: No   OB History    No data available     Review of Systems  Constitutional: Negative.  Negative for fever and chills.  HENT: Positive for congestion, postnasal drip and rhinorrhea. Negative for ear discharge.   Respiratory: Positive for cough. Negative for shortness of breath.   Cardiovascular: Negative.   Gastrointestinal: Negative.   Genitourinary: Negative.     Allergies  Review of patient's allergies indicates no known allergies.  Home Medications   Prior to Admission medications   Medication Sig Start Date End Date Taking? Authorizing Provider  diclofenac (CATAFLAM) 50 MG tablet Take 1 tablet (50 mg total) by mouth 3 (three) times daily. As needed for pain 08/03/14   Ria ClockJennifer Lee H Presson, PA  norgestrel-ethinyl estradiol (LO/OVRAL,CRYSELLE) 0.3-30 MG-MCG tablet Take 1 tablet by mouth daily.    Historical Provider, MD  penicillin v potassium (VEETID) 500 MG tablet Take 1 tablet (500 mg total) by mouth 3 (three) times  daily. X 7 days 08/03/14   Ria ClockJennifer Lee H Presson, PA  predniSONE (DELTASONE) 20 MG tablet 2 daily with food 05/21/14   Elvina SidleKurt Lauenstein, MD   BP 109/73 mmHg  Pulse 80  Temp(Src) 98.6 F (37 C) (Oral)  Resp 16  SpO2 100%  LMP 02/15/2015 Physical Exam  Constitutional: She appears well-developed and well-nourished. No distress.  HENT:  Mouth/Throat: No oropharyngeal exudate.  Bilateral TMs are normal Oropharynx with thick clear PND  Eyes: EOM are normal.  Neck: Normal range of motion. Neck supple.  Cardiovascular: Normal rate, regular rhythm, normal heart sounds and intact distal pulses.   Pulmonary/Chest: Effort normal and breath sounds normal. No respiratory distress. She has no wheezes. She has no rales.  Lymphadenopathy:    She has no cervical adenopathy.  Neurological: She is alert. She exhibits normal muscle tone.  Skin: Skin is dry.  Psychiatric: She has a normal mood and affect.  Nursing note and vitals reviewed.   ED Course  Procedures (including critical care time) Labs Review Labs Reviewed - No data to display  Imaging Review No results found.   MDM   1. Allergic rhinitis due to pollen    Nondrowsy formula's for allergies include Allegra, Claritin and Zyrtec. For congestion may use Sudafed PE 10 mg Recommend Rhinocort or Flonase nasal spray May also use nasal saline spray frequently Drink lots of fluids stay well-hydrated     Hayden Rasmussenavid Elise Gladden, NP 02/26/15 1549

## 2015-03-23 ENCOUNTER — Ambulatory Visit
Admission: RE | Admit: 2015-03-23 | Discharge: 2015-03-23 | Disposition: A | Discharge status: Home / Self Care | Payer: 59 | Source: Ambulatory Visit | Attending: Physician Assistant | Admitting: Physician Assistant

## 2015-03-23 ENCOUNTER — Other Ambulatory Visit: Payer: Self-pay | Admitting: Physician Assistant

## 2015-03-23 DIAGNOSIS — R200 Anesthesia of skin: Secondary | ICD-10-CM

## 2015-09-11 ENCOUNTER — Encounter (HOSPITAL_COMMUNITY): Payer: Self-pay | Admitting: *Deleted

## 2015-09-11 ENCOUNTER — Emergency Department (HOSPITAL_COMMUNITY)
Admission: EM | Admit: 2015-09-11 | Discharge: 2015-09-11 | Disposition: A | Discharge status: Home / Self Care | Payer: 59 | Source: Home / Self Care | Attending: Emergency Medicine | Admitting: Emergency Medicine

## 2015-09-11 DIAGNOSIS — K047 Periapical abscess without sinus: Secondary | ICD-10-CM

## 2015-09-11 MED ORDER — HYDROCODONE-ACETAMINOPHEN 5-325 MG PO TABS: 1.0000 | ORAL_TABLET | Freq: Four times a day (QID) | ORAL | Status: DC | PRN

## 2015-09-11 MED ORDER — IBUPROFEN 800 MG PO TABS: 800.0000 mg | ORAL_TABLET | Freq: Three times a day (TID) | ORAL | Status: DC | PRN

## 2015-09-11 MED ORDER — PENICILLIN V POTASSIUM 500 MG PO TABS: 500.0000 mg | ORAL_TABLET | Freq: Four times a day (QID) | ORAL | Status: AC

## 2015-09-11 NOTE — ED Notes (Signed)
Toothache since  Three am

## 2015-09-11 NOTE — Discharge Instructions (Signed)
You have a dental infection. Take penicillin 4 times a day for 10 days. Continue ibuprofen 800 mg 3 times a day. Use the Norco every 4-6 hours as needed for severe pain. Not friable taking this medicine. Do saltwater gargles 3 or 4 times a day. Please follow-up with a dentist as soon as possible.

## 2015-09-11 NOTE — ED Provider Notes (Signed)
CSN: [id number]     Arrival date & time 09/11/15  1813 History   First MD Initiated Contact with Patient 09/11/15 1926     Chief Complaint  Patient presents with  . Dental Pain   (Consider location/radiation/quality/duration/timing/severity/associated sxs/prior Treatment) HPI  She is a 24 year old woman here for evaluation of dental pain. She states she woke up at 3 AM with a throbbing toothache. It is in the left lower molar. She has some pain with swallowing. She also reports feeling a little bump by the tooth. She does not currently have a dentist.  No fevers or chills. No swelling. No nausea or vomiting. She has not been eating much due to pain with chewing.  She has been taking 800 milligram ibuprofen with mild improvement.  Past Medical History  Diagnosis Date  . Heart murmur   . Chest pain   . SOB (shortness of breath)    History reviewed. No pertinent past surgical history. Family History  Problem Relation Age of Onset  . Hypertension Mother   . Hyperlipidemia Mother   . Hypertension Father    Social History  Substance Use Topics  . Smoking status: Former Smoker    Types: Cigarettes    Quit date: 06/03/2009  . Smokeless tobacco: None  . Alcohol Use: No   OB History    No data available     Review of Systems As in history of present illness Allergies  Review of patient's allergies indicates no known allergies.  Home Medications   Prior to Admission medications   Medication Sig Start Date End Date Taking? Authorizing Provider  HYDROcodone-acetaminophen (NORCO) 5-325 MG tablet Take 1 tablet by mouth every 6 (six) hours as needed for moderate pain. 09/11/15   Charm Rings, MD  ibuprofen (ADVIL,MOTRIN) 800 MG tablet Take 1 tablet (800 mg total) by mouth every 8 (eight) hours as needed for moderate pain. 09/11/15   Charm Rings, MD  penicillin v potassium (VEETID) 500 MG tablet Take 1 tablet (500 mg total) by mouth 4 (four) times daily. 09/11/15 09/18/15  Charm Rings,  MD   Meds Ordered and Administered this Visit  Medications - No data to display  BP 159/90 mmHg  Pulse 71  Temp(Src) 99.1 F (37.3 C) (Oral)  Resp 18  SpO2 99%  LMP 09/11/2015 No data found.   Physical Exam  Constitutional: She is oriented to person, place, and time. She appears well-developed and well-nourished. No distress.  HENT:  Mouth/Throat:    Cardiovascular: Normal rate.   Pulmonary/Chest: Effort normal.  Neurological: She is alert and oriented to person, place, and time.    ED Course  Procedures (including critical care time)  Labs Review Labs Reviewed - No data to display  Imaging Review No results found.   MDM   1. Dental infection    Treat with penicillin, ibuprofen, and Norco. Follow-up with dentist as soon as possible.    Charm Rings, MD 09/11/15 [phone]

## 2015-11-10 ENCOUNTER — Encounter (HOSPITAL_COMMUNITY): Payer: Self-pay | Admitting: Advanced Practice Midwife

## 2015-11-10 ENCOUNTER — Inpatient Hospital Stay (HOSPITAL_COMMUNITY)
Admission: AD | Admit: 2015-11-10 | Discharge: 2015-11-10 | Disposition: A | Discharge status: Home / Self Care | Payer: Medicaid Other | Source: Ambulatory Visit | Attending: Obstetrics and Gynecology | Admitting: Obstetrics and Gynecology

## 2015-11-10 DIAGNOSIS — Z3201 Encounter for pregnancy test, result positive: Secondary | ICD-10-CM

## 2015-11-10 DIAGNOSIS — Z87891 Personal history of nicotine dependence: Secondary | ICD-10-CM | POA: Diagnosis not present

## 2015-11-10 DIAGNOSIS — Z3200 Encounter for pregnancy test, result unknown: Secondary | ICD-10-CM | POA: Diagnosis present

## 2015-11-10 DIAGNOSIS — N911 Secondary amenorrhea: Secondary | ICD-10-CM | POA: Diagnosis not present

## 2015-11-10 LAB — POCT PREGNANCY, URINE: Preg Test, Ur: POSITIVE — AB

## 2015-11-10 MED ORDER — CONCEPT OB 130-92.4-1 MG PO CAPS: 1.0000 | ORAL_CAPSULE | Freq: Every day | ORAL | Status: DC

## 2015-11-10 NOTE — Discharge Instructions (Signed)
Prenatal Care °WHAT IS PRENATAL CARE?  °Prenatal care is the process of caring for a pregnant woman before she gives birth. Prenatal care makes sure that she and her baby remain as healthy as possible throughout pregnancy. Prenatal care may be provided by a midwife, family practice health care provider, or a childbirth and pregnancy specialist (obstetrician). Prenatal care may include physical examinations, testing, treatments, and education on nutrition, lifestyle, and social support services. °WHY IS PRENATAL CARE SO IMPORTANT?  °Early and consistent prenatal care increases the chance that you and your baby will remain healthy throughout your pregnancy. This type of care also decreases a baby's risk of being born too early (prematurely), or being born smaller than expected (small for gestational age). Any underlying medical conditions you may have that could pose a risk during your pregnancy are discussed during prenatal care visits. You will also be monitored regularly for any new conditions that may arise during your pregnancy so they can be treated quickly and effectively. °WHAT HAPPENS DURING PRENATAL CARE VISITS? °Prenatal care visits may include the following: °Discussion °Tell your health care provider about any new signs or symptoms you have experienced since your last visit. These might include: °· Nausea or vomiting. °· Increased or decreased level of energy. °· Difficulty sleeping. °· Back or leg pain. °· Weight changes. °· Frequent urination. °· Shortness of breath with physical activity. °· Changes in your skin, such as the development of a rash or itchiness. °· Vaginal discharge or bleeding. °· Feelings of excitement or nervousness. °· Changes in your baby's movements. °You may want to write down any questions or topics you want to discuss with your health care provider and bring them with you to your appointment. °Examination °During your first prenatal care visit, you will likely have a complete  physical exam. Your health care provider will often examine your vagina, cervix, and the position of your uterus, as well as check your heart, lungs, and other body systems. As your pregnancy progresses, your health care provider will measure the size of your uterus and your baby's position inside your uterus. He or she may also examine you for early signs of labor. Your prenatal visits may also include checking your blood pressure and, after about 10-12 weeks of pregnancy, listening to your baby's heartbeat. °Testing °Regular testing often includes: °· Urinalysis. This checks your urine for glucose, protein, or signs of infection. °· Blood count. This checks the levels of white and red blood cells in your body. °· Tests for sexually transmitted infections (STIs). Testing for STIs at the beginning of pregnancy is routinely done and is required in many states. °· Antibody testing. You will be checked to see if you are immune to certain illnesses, such as rubella, that can affect a developing fetus. °· Glucose screen. Around 24-28 weeks of pregnancy, your blood glucose level will be checked for signs of gestational diabetes. Follow-up tests may be recommended. °· Group B strep. This is a bacteria that is commonly found inside a woman's vagina. This test will inform your health care provider if you need an antibiotic to reduce the amount of this bacteria in your body prior to labor and childbirth. °· Ultrasound. Many pregnant women undergo an ultrasound screening around 18-20 weeks of pregnancy to evaluate the health of the fetus and check for any developmental abnormalities. °· HIV (human immunodeficiency virus) testing. Early in your pregnancy, you will be screened for HIV. If you are at high risk for HIV, this test   may be repeated during your third trimester of pregnancy. °You may be offered other testing based on your age, personal or family medical history, or other factors.  °HOW OFTEN SHOULD I PLAN TO SEE MY  HEALTH CARE PROVIDER FOR PRENATAL CARE? °Your prenatal care check-up schedule depends on any medical conditions you have before, or develop during, your pregnancy. If you do not have any underlying medical conditions, you will likely be seen for checkups: °· Monthly, during the first 6 months of pregnancy. °· Twice a month during months 7 and 8 of pregnancy. °· Weekly starting in the 9th month of pregnancy and until delivery. °If you develop signs of early labor or other concerning signs or symptoms, you may need to see your health care provider more often. Ask your health care provider what prenatal care schedule is best for you. °WHAT CAN I DO TO KEEP MYSELF AND MY BABY AS HEALTHY AS POSSIBLE DURING MY PREGNANCY? °· Take a prenatal vitamin containing 400 micrograms (0.4 mg) of folic acid every day. Your health care provider may also ask you to take additional vitamins such as iodine, vitamin D, iron, copper, and zinc. °· Take 1500-2000 mg of calcium daily starting at your 20th week of pregnancy until you deliver your baby. °· Make sure you are up to date on your vaccinations. Unless directed otherwise by your health care provider: °¨ You should receive a tetanus, diphtheria, and pertussis (Tdap) vaccination between the 27th and 36th week of your pregnancy, regardless of when your last Tdap immunization occurred. This helps protect your baby from whooping cough (pertussis) after he or she is born. °¨ You should receive an annual inactivated influenza vaccine (IIV) to help protect you and your baby from influenza. This can be done at any point during your pregnancy. °· Eat a well-rounded diet that includes: °¨ Fresh fruits and vegetables. °¨ Lean proteins. °¨ Calcium-rich foods such as milk, yogurt, hard cheeses, and dark, leafy greens. °¨ Whole grain breads. °· Do not eat seafood high in mercury, including: °¨ Swordfish. °¨ Tilefish. °¨ Shark. °¨ King mackerel. °¨ More than 6 oz tuna per week. °· Do not eat: °¨ Raw  or undercooked meats or eggs. °¨ Unpasteurized foods, such as soft cheeses (brie, blue, or feta), juices, and milks. °¨ Lunch meats. °¨ Hot dogs that have not been heated until they are steaming. °· Drink enough water to keep your urine clear or pale yellow. For many women, this may be 10 or more 8 oz glasses of water each day. Keeping yourself hydrated helps deliver nutrients to your baby and may prevent the start of pre-term uterine contractions. °· Do not use any tobacco products including cigarettes, chewing tobacco, or electronic cigarettes. If you need help quitting, ask your health care provider. °· Do not drink beverages containing alcohol. No safe level of alcohol consumption during pregnancy has been determined. °· Do not use any illegal drugs. These can harm your developing baby or cause a miscarriage. °· Ask your health care provider or pharmacist before taking any prescription or over-the-counter medicines, herbs, or supplements. °· Limit your caffeine intake to no more than 200 mg per day. °· Exercise. Unless told otherwise by your health care provider, try to get 30 minutes of moderate exercise most days of the week. Do not  do high-impact activities, contact sports, or activities with a high risk of falling, such as horseback riding or downhill skiing. °· Get plenty of rest. °· Avoid anything that raises your   body temperature, such as hot tubs and saunas. °· If you own a cat, do not empty its litter box. Bacteria contained in cat feces can cause an infection called toxoplasmosis. This can result in serious harm to the fetus. °· Stay away from chemicals such as insecticides, lead, mercury, and cleaning or paint products that contain solvents. °· Do not have any X-rays taken unless medically necessary. °· Take a childbirth and breastfeeding preparation class. Ask your health care provider if you need a referral or recommendation. °  °This information is not intended to replace advice given to you by  your health care provider. Make sure you discuss any questions you have with your health care provider. °  °Document Released: 11/22/2003 Document Revised: 12/10/2014 Document Reviewed: 02/03/2014 °Elsevier Interactive Patient Education ©2016 [employer] ° °

## 2015-11-10 NOTE — MAU Provider Note (Signed)
Chief Complaint: Possible Pregnancy  First Provider Initiated Contact with Patient 11/10/15 1029      SUBJECTIVE HPI: Debbie Wood is a 24 y.o. year old G1P0 female who presents to MAU reporting being late for period. Wants to know if she is pregnant, but hasn't taken UPT. Unprotected IC. Took a plan B 10/15/15.     Associated signs and symptoms: Neg for abd pain, VB.   Past Medical History  Diagnosis Date  . Heart murmur   . Chest pain   . SOB (shortness of breath)    OB History  No data available   No past surgical history on file. Social History   Social History  . Marital Status: Single    Spouse Name: N/A  . Number of Children: N/A  . Years of Education: N/A   Occupational History  . Not on file.   Social History Main Topics  . Smoking status: Former Smoker    Types: Cigarettes    Quit date: 06/03/2009  . Smokeless tobacco: Not on file  . Alcohol Use: No  . Drug Use: No  . Sexual Activity: Not on file   Other Topics Concern  . Not on file   Social History Narrative   No current facility-administered medications on file prior to encounter.   Current Outpatient Prescriptions on File Prior to Encounter  Medication Sig Dispense Refill  . HYDROcodone-acetaminophen (NORCO) 5-325 MG tablet Take 1 tablet by mouth every 6 (six) hours as needed for moderate pain. 20 tablet 0  . ibuprofen (ADVIL,MOTRIN) 800 MG tablet Take 1 tablet (800 mg total) by mouth every 8 (eight) hours as needed for moderate pain. 30 tablet 0  . [DISCONTINUED] norgestrel-ethinyl estradiol (LO/OVRAL,CRYSELLE) 0.3-30 MG-MCG tablet Take 1 tablet by mouth daily.     No Known Allergies  I have reviewed the past Medical Hx, Surgical Hx, Social Hx, Allergies and Medications.   Review of Systems  Constitutional: Negative for fever and chills.  Gastrointestinal: Negative for abdominal pain.  Genitourinary: Negative for vaginal bleeding.    OBJECTIVE Patient Vitals for the past 24 hrs:  BP Temp Pulse Resp Height Weight  11/10/15 0902 114/79 mmHg 98.5 F (36.9 C) 104 18  (1.6 m) 122 lb 6.4 oz (55.52 kg)   Constitutional: Well-developed, well-nourished female in no acute distress.  Cardiovascular: normal rate Respiratory: normal rate and effort.  GI: Abd soft, non-tender. Fundus non-palpable Neurologic: Alert and oriented x 4.  GU: Deferred  LAB RESULTS Results for orders placed or performed during the hospital encounter of 11/10/15 (from the past 168 hour(s))  Pregnancy, urine POC   Collection Time: 11/10/15  9:47 AM  Result Value Ref Range   Preg Test, Ur POSITIVE (A) NEGATIVE    IMAGING No results found.  MAU COURSE UPT  MDM Pos UPT w/ out any other complaints.   ASSESSMENT 1. Secondary amenorrhea   2. Positive pregnancy test    PLAN Discharge home in stable condition. First trimester precautions   Follow-up Information    Follow up with Obstetrician of your choice.   Why:  Start prenatal care        Medication List    STOP taking these medications        HYDROcodone-acetaminophen 5-325 MG tablet  Commonly known as:  NORCO     ibuprofen 800 MG tablet  Commonly known as:  ADVIL,MOTRIN      TAKE these medications        CONCEPT OB 130-92.4-1 MG  Caps  Take 1 tablet by mouth daily.         [place] Kimberlyann Hollar, CNM 11/10/2015  10:05 AM  4

## 2015-11-10 NOTE — MAU Note (Signed)
Pt reports she took a plan B 10/15/15. Period ws due end of Nov. Has not had a period. Want to know if she is pregnant. No other complaints.

## 2015-12-04 NOTE — L&D Delivery Note (Signed)
Delivery Note At  a viable female was delivered via  (Presentation vertex: ;LOA  ).  APGAR:9 ,9 ; weight  .   Placenta status: spont , via shultz.  Cord:3vc  with the following complications: none.  Cord pH: n/a  Anesthesia:  none Episiotomy:  none Lacerations:  none Suture Repair: n/a Est. Blood Loss 100 (mL):    Mom to postpartum.  Baby to Couplet care / Skin to Skin.  Debbie Wood 07/01/2016, 6:06 PM

## 2015-12-13 ENCOUNTER — Encounter (HOSPITAL_COMMUNITY): Payer: Self-pay

## 2015-12-13 ENCOUNTER — Inpatient Hospital Stay (HOSPITAL_COMMUNITY)
Admission: AD | Admit: 2015-12-13 | Discharge: 2015-12-13 | Disposition: A | Discharge status: Home / Self Care | Payer: Medicaid Other | Source: Ambulatory Visit | Attending: Obstetrics | Admitting: Obstetrics

## 2015-12-13 ENCOUNTER — Emergency Department (HOSPITAL_COMMUNITY): Admission: EM | Admit: 2015-12-13 | Discharge: 2015-12-13 | Discharge status: Absent without leave | Payer: Self-pay

## 2015-12-13 DIAGNOSIS — O4691 Antepartum hemorrhage, unspecified, first trimester: Secondary | ICD-10-CM | POA: Diagnosis not present

## 2015-12-13 DIAGNOSIS — Z87891 Personal history of nicotine dependence: Secondary | ICD-10-CM | POA: Diagnosis not present

## 2015-12-13 DIAGNOSIS — R011 Cardiac murmur, unspecified: Secondary | ICD-10-CM | POA: Diagnosis not present

## 2015-12-13 DIAGNOSIS — N939 Abnormal uterine and vaginal bleeding, unspecified: Secondary | ICD-10-CM | POA: Diagnosis not present

## 2015-12-13 DIAGNOSIS — Z3A10 10 weeks gestation of pregnancy: Secondary | ICD-10-CM | POA: Diagnosis not present

## 2015-12-13 DIAGNOSIS — O209 Hemorrhage in early pregnancy, unspecified: Secondary | ICD-10-CM

## 2015-12-13 LAB — URINALYSIS, ROUTINE W REFLEX MICROSCOPIC
Bilirubin Urine: NEGATIVE
Glucose, UA: NEGATIVE mg/dL
Hgb urine dipstick: NEGATIVE
Ketones, ur: NEGATIVE mg/dL
NITRITE: NEGATIVE
Protein, ur: 30 mg/dL — AB
SPECIFIC GRAVITY, URINE: 1.02 (ref 1.005–1.030)
pH: 6 (ref 5.0–8.0)

## 2015-12-13 LAB — URINE MICROSCOPIC-ADD ON: RBC / HPF: NONE SEEN RBC/hpf (ref 0–5)

## 2015-12-13 NOTE — MAU Provider Note (Signed)
History     CSN: [id number]  Arrival date and time: 12/13/15 0141   First Provider Initiated Contact with Patient 12/13/15 0210      Chief Complaint  Patient presents with  . Vaginal Bleeding   HPI Ms. Riyana A Harr is a 25 y.o. G3P2001 at [redacted]w[redacted]d who presents to MAU today with complaint of vaginal bleeding. The patient states that she woke up to use the bathroom around 0130 today and noted blood in the toilet and some on the tissue. She did not require a pad to come into MAU. She denies abdominal pain, vaginal discharge, fever or recent intercourse. This is the first episode of bleeding she has had during this pregnancy. She has her first prenatal appointment on 12/31/15, but states that Dr. Clearance Coots has delivered her other babies as well. She also states frequent N/V but denies use of medications.   OB History    Gravida Para Term Preterm AB TAB SAB Ectopic Multiple Living   3 2 2       1       Past Medical History  Diagnosis Date  . Heart murmur   . Chest pain   . SOB (shortness of breath)     Past Surgical History  Procedure Laterality Date  . No past surgeries      Family History  Problem Relation Age of Onset  . Hypertension Mother   . Hyperlipidemia Mother   . Hypertension Father     Social History  Substance Use Topics  . Smoking status: Former Smoker    Types: Cigarettes    Quit date: 06/03/2009  . Smokeless tobacco: None  . Alcohol Use: No    Allergies: No Known Allergies  No prescriptions prior to admission    Review of Systems  Constitutional: Negative for fever and malaise/fatigue.  Gastrointestinal: Positive for nausea and vomiting. Negative for abdominal pain, diarrhea and constipation.  Genitourinary:       + vaginal bleeding   Physical Exam   Blood pressure 132/85, pulse 88, temperature 98.5 F (36.9 C), temperature source Oral, resp. rate 20, height 5\' 2"  (1.575 m), weight 125 lb 9.6 oz (56.972 kg), last menstrual period 09/30/2015,  SpO2 100 %.  Physical Exam  Nursing note and vitals reviewed. Constitutional: She is oriented to person, place, and time. She appears well-developed and well-nourished. No distress.  HENT:  Head: Normocephalic and atraumatic.  Cardiovascular: Normal rate.   Respiratory: Effort normal.  GI: Soft. She exhibits no distension and no mass. There is no tenderness. There is no rebound and no guarding.  Genitourinary: Uterus is enlarged (appropriate for GA). Uterus is not tender. Cervix exhibits no motion tenderness, no discharge and no friability. There is bleeding (scant blood noted) in the vagina. No vaginal discharge found.  Neurological: She is alert and oriented to person, place, and time.  Skin: Skin is warm and dry. No erythema.  Psychiatric: She has a normal mood and affect.  Dilation: Closed Effacement (%): Thick Cervical Position: Posterior Exam by:: Harlon Flor PA   MAU Course  Procedures None  MDM FHR - 178 bpm with doppler Scant bleeding on exam. Cervix is closed. Due to early gestation [place] would not change management.  Strict bleeding precautions discussed. Patient to return for increased bleeding (1-2 pads/hour), abdominal pain or fever.   Assessment and Plan  A: SIUP at [redacted]w[redacted]d Vaginal bleeding in pregnancy, first trimester  P: Discharge home Bleeding precautions discussed Patient advised to follow-up with Dr. Clearance Coots as  scheduled or sooner if symptoms worsen Patient may return to MAU as needed or if her condition were to change or worsen   Marny LowensteinJulie N Simisola Sandles, PA-C  12/13/2015, 2:26 AM

## 2015-12-13 NOTE — Discharge Instructions (Signed)
Pelvic Rest °Pelvic rest is sometimes recommended for women when:  °· The placenta is partially or completely covering the opening of the cervix (placenta previa). °· There is bleeding between the uterine wall and the amniotic sac in the first trimester (subchorionic hemorrhage). °· The cervix begins to open without labor starting (incompetent cervix, cervical insufficiency). °· The labor is too early (preterm labor). °HOME CARE INSTRUCTIONS °· Do not have sexual intercourse, stimulation, or an orgasm. °· Do not use tampons, douche, or put anything in the vagina. °· Do not lift anything over 10 pounds (4.5 kg). °· Avoid strenuous activity or straining your pelvic muscles. °SEEK MEDICAL CARE IF:  °· You have any vaginal bleeding during pregnancy. Treat this as a potential emergency. °· You have cramping pain felt low in the stomach (stronger than menstrual cramps). °· You notice vaginal discharge (watery, mucus, or bloody). °· You have a low, dull backache. °· There are regular contractions or uterine tightening. °SEEK IMMEDIATE MEDICAL CARE IF: °You have vaginal bleeding and have placenta previa.  °  °This information is not intended to replace advice given to you by your health care provider. Make sure you discuss any questions you have with your health care provider. °  °Document Released: 03/16/2011 Document Revised: 02/11/2012 Document Reviewed: 05/23/2015 °Elsevier Interactive Patient Education ©2016 [employer] Inc[employer] ° °Vaginal Bleeding During Pregnancy, First Trimester °A small amount of bleeding (spotting) from the vagina is common in early pregnancy. Sometimes the bleeding is normal and is not a problem, and sometimes it is a sign of something serious. Be sure to tell your doctor about any bleeding from your vagina right away. °HOME CARE °· Watch your condition for any changes. °· Follow your doctor's instructions about how active you can be. °· If you are on bed rest: °¨ You may need to stay in bed and only  get up to use the bathroom. °¨ You may be allowed to do some activities. °¨ If you need help, make plans for someone to help you. °· Write down: °¨ The number of pads you use each day. °¨ How often you change pads. °¨ How soaked (saturated) your pads are. °· Do not use tampons. °· Do not douche. °· Do not have sex or orgasms until your doctor says it is okay. °· If you pass any tissue from your vagina, save the tissue so you can show it to your doctor. °· Only take medicines as told by your doctor. °· Do not take aspirin because it can make you bleed. °· Keep all follow-up visits as told by your doctor. °GET HELP IF:  °· You bleed from your vagina. °· You have cramps. °· You have labor pains. °· You have a fever that does not go away after you take medicine. °GET HELP RIGHT AWAY IF:  °· You have very bad cramps in your back or belly (abdomen). °· You pass large clots or tissue from your vagina. °· You bleed more. °· You feel light-headed or weak. °· You pass out (faint). °· You have chills. °· You are leaking fluid or have a gush of fluid from your vagina. °· You pass out while pooping (having a bowel movement). °MAKE SURE YOU: °· Understand these instructions. °· Will watch your condition. °· Will get help right away if you are not doing well or get worse. °  °This information is not intended to replace advice given to you by your health care provider. Make sure you discuss any questions   you have with your health care provider. °  °Document Released: 04/05/2014 Document Reviewed: 04/05/2014 °Elsevier Interactive Patient Education ©2016 [employer]. ° °

## 2015-12-13 NOTE — MAU Note (Signed)
Pt states she woke up around 130am and noticed blood in toilet and on tissue. Pt is not wearing a pad in triage. Denies pain, but having a lot of nausea and vomiting.

## 2015-12-23 ENCOUNTER — Ambulatory Visit (INDEPENDENT_AMBULATORY_CARE_PROVIDER_SITE_OTHER): Payer: Medicaid Other | Admitting: Certified Nurse Midwife

## 2015-12-23 ENCOUNTER — Encounter: Payer: Self-pay | Admitting: Certified Nurse Midwife

## 2015-12-23 VITALS — BP 127/74 | HR 112 | Temp 98.6°F | Wt 120.0 lb

## 2015-12-23 DIAGNOSIS — Z3481 Encounter for supervision of other normal pregnancy, first trimester: Secondary | ICD-10-CM

## 2015-12-23 DIAGNOSIS — O219 Vomiting of pregnancy, unspecified: Secondary | ICD-10-CM | POA: Diagnosis not present

## 2015-12-23 DIAGNOSIS — R011 Cardiac murmur, unspecified: Secondary | ICD-10-CM

## 2015-12-23 LAB — POCT URINALYSIS DIPSTICK
Bilirubin, UA: NEGATIVE
Blood, UA: NEGATIVE
Glucose, UA: NEGATIVE
Ketones, UA: NEGATIVE
LEUKOCYTES UA: NEGATIVE
Nitrite, UA: NEGATIVE
PH UA: 7
SPEC GRAV UA: 1.015
UROBILINOGEN UA: NEGATIVE

## 2015-12-23 MED ORDER — DOXYLAMINE-PYRIDOXINE 10-10 MG PO TBEC: DELAYED_RELEASE_TABLET | ORAL | Status: DC

## 2015-12-23 NOTE — Progress Notes (Signed)
Subjective:    Debbie Wood is being seen today for her first obstetrical visit.  This is not a planned pregnancy. She is at [redacted]w[redacted]d gestation. Her obstetrical history is significant for none. Relationship with FOB: significant other, not living together. Patient does intend to breast feed. Pregnancy history fully reviewed.  Had tried antidepressants in the past but did not like the side effects.  First child lived 11 days and died, of sepsis ? Herpes.  Has not been in counseling.  Patient does not have herpes.  History of murmur before pregnancy.    The information documented in the HPI was reviewed and verified.  Menstrual History: OB History    Gravida Para Term Preterm AB TAB SAB Ectopic Multiple Living   Menarche age: 25 years of age.     Patient's last menstrual period was 09/30/2015.    Past Medical History  Diagnosis Date  . Heart murmur   . Chest pain   . SOB (shortness of breath)     Past Surgical History  Procedure Laterality Date  . No past surgeries       (Not in a hospital admission) No Known Allergies  Social History  Substance Use Topics  . Smoking status: Former Smoker    Types: Cigarettes    Quit date: 06/03/2009  . Smokeless tobacco: Not on file  . Alcohol Use: No    Family History  Problem Relation Age of Onset  . Hypertension Mother   . Hyperlipidemia Mother   . Hypertension Father      Review of Systems Constitutional: negative for weight loss Gastrointestinal: + for nausea & vomiting Genitourinary:negative for genital lesions and vaginal discharge and dysuria Musculoskeletal:negative for back pain Behavioral/Psych: negative for abusive relationship, depression, illegal drug usage and tobacco use    Objective:    BP 127/74 mmHg  Pulse 112  Temp(Src) 98.6 F (37 C)  Wt 120 lb (54.432 kg)  LMP 09/30/2015 General Appearance:    Alert, cooperative, no distress, appears stated age  Head:    Normocephalic, without  obvious abnormality, atraumatic  Eyes:    PERRL, conjunctiva/corneas clear, EOM's intact, fundi    benign, both eyes  Ears:    Normal TM's and external ear canals, both ears  Nose:   Nares normal, septum midline, mucosa normal, no drainage    or sinus tenderness  Throat:   Lips, mucosa, and tongue normal; teeth and gums normal  Neck:   Supple, symmetrical, trachea midline, no adenopathy;    thyroid:  no enlargement/tenderness/nodules; no carotid   bruit or JVD  Back:     Symmetric, no curvature, ROM normal, no CVA tenderness  Lungs:     Clear to auscultation bilaterally, respirations unlabored  Chest Wall:    No tenderness or deformity   Heart:    Regular rate and rhythm, S1 and S2 normal, no rub   or gallop, + grade 3 murmur  Breast Exam:    No tenderness, masses, or nipple abnormality  Abdomen:     Soft, non-tender, bowel sounds active all four quadrants,    no masses, no organomegaly  Genitalia:    Normal female without lesion, discharge or tenderness  Extremities:   Extremities normal, atraumatic, no cyanosis or edema  Pulses:   2+ and symmetric all extremities  Skin:   Skin color, texture, turgor normal, no rashes or lesions  Lymph nodes:  Cervical, supraclavicular, and axillary nodes normal  Neurologic:   CNII-XII intact, normal strength, sensation and reflexes    throughout       Cervix:  Long, thick, closed and posterior.  FHR; 160's    Lab Review Urine pregnancy test Labs reviewed yes Radiologic studies reviewed no Assessment:    Pregnancy at [redacted]w[redacted]d weeks   Murmur  Hx of neonatal demise with first child from sepsis  N&V in early pregnancy  Plan:      Prenatal vitamins.  Counseling provided regarding continued use of seat belts, cessation of alcohol consumption, smoking or use of illicit drugs; infection precautions i.e., influenza/TDAP immunizations, toxoplasmosis,CMV, parvovirus, listeria and varicella; workplace safety, exercise during pregnancy; routine dental  care, safe medications, sexual activity, hot tubs, saunas, pools, travel, caffeine use, fish and methlymercury, potential toxins, hair treatments, varicose veins Weight gain recommendations per IOM guidelines reviewed: underweight/BMI< 18.5--> gain 28 - 40 lbs; normal weight/BMI 18.5 - 24.9--> gain 25 - 35 lbs; overweight/BMI 25 - 29.9--> gain 15 - 25 lbs; obese/BMI >30->gain  11 - 20 lbs Problem list reviewed and updated. FIRST/CF mutation testing/NIPT/QUAD SCREEN/fragile X/Ashkenazi Jewish population testing/Spinal muscular atrophy discussed: requested. Role of ultrasound in pregnancy discussed; fetal survey: requested. Amniocentesis discussed: not indicated. VBAC calculator score: VBAC consent form provided Meds ordered this encounter  Medications  . Doxylamine-Pyridoxine (DICLEGIS) 10-10 MG TBEC    Sig: Take 1 tablet with breakfast and lunch.  Take 2 tablets at bedtime.    Dispense:  100 tablet    Refill:  4   Orders Placed This Encounter  Procedures  . Culture, OB Urine  . SureSwab, Vaginosis/Vaginitis Plus  . Obstetric panel  . HIV antibody  . Hemoglobinopathy evaluation  . Varicella zoster antibody, IgG  . VITAMIN D 25 Hydroxy (Vit-D Deficiency, Fractures)  . TSH  . Ambulatory referral to Cardiology    Referral Priority:  Routine    Referral Type:  Consultation    Referral Reason:  Specialty Services Required    Requested Specialty:  Cardiology    Number of Visits Requested:  1  . POCT urinalysis dipstick    Follow up in 4 weeks. 50% of 30 min visit spent on counseling and coordination of care.

## 2015-12-24 LAB — HIV ANTIBODY (ROUTINE TESTING W REFLEX): HIV 1&2 Ab, 4th Generation: NONREACTIVE

## 2015-12-24 LAB — TSH: TSH: 0.456 u[IU]/mL (ref 0.350–4.500)

## 2015-12-24 LAB — CULTURE, OB URINE
Colony Count: NO GROWTH
ORGANISM ID, BACTERIA: NO GROWTH

## 2015-12-24 LAB — VITAMIN D 25 HYDROXY (VIT D DEFICIENCY, FRACTURES): VIT D 25 HYDROXY: 5 ng/mL — AB (ref 30–100)

## 2015-12-26 LAB — PAP IG W/ RFLX HPV ASCU

## 2015-12-26 LAB — OBSTETRIC PANEL
ANTIBODY SCREEN: NEGATIVE
BASOS ABS: 0 10*3/uL (ref 0.0–0.1)
Basophils Relative: 0 % (ref 0–1)
EOS PCT: 0 % (ref 0–5)
Eosinophils Absolute: 0 10*3/uL (ref 0.0–0.7)
HEMATOCRIT: 34.9 % — AB (ref 36.0–46.0)
Hemoglobin: 11.9 g/dL — ABNORMAL LOW (ref 12.0–15.0)
Hepatitis B Surface Ag: NEGATIVE
Lymphocytes Relative: 20 % (ref 12–46)
Lymphs Abs: 2.4 10*3/uL (ref 0.7–4.0)
MCH: 29.6 pg (ref 26.0–34.0)
MCHC: 34.1 g/dL (ref 30.0–36.0)
MCV: 86.8 fL (ref 78.0–100.0)
MPV: 8.7 fL (ref 8.6–12.4)
Monocytes Absolute: 0.7 10*3/uL (ref 0.1–1.0)
Monocytes Relative: 6 % (ref 3–12)
NEUTROS ABS: 9 10*3/uL — AB (ref 1.7–7.7)
Neutrophils Relative %: 74 % (ref 43–77)
Platelets: 339 10*3/uL (ref 150–400)
RBC: 4.02 MIL/uL (ref 3.87–5.11)
RDW: 13.8 % (ref 11.5–15.5)
Rh Type: POSITIVE
Rubella: 4.42 Index — ABNORMAL HIGH (ref <0.90)
WBC: 12.2 10*3/uL — ABNORMAL HIGH (ref 4.0–10.5)

## 2015-12-26 LAB — VARICELLA ZOSTER ANTIBODY, IGG: VARICELLA IGG: 2013 {index} — AB (ref <135.00)

## 2015-12-27 LAB — HEMOGLOBINOPATHY EVALUATION
HEMOGLOBIN OTHER: 0 %
HGB A2 QUANT: 2.8 % (ref 2.2–3.2)
HGB F QUANT: 0 % (ref 0.0–2.0)
Hgb A: 61.2 % — ABNORMAL LOW (ref 96.8–97.8)
Hgb S Quant: 36 % — ABNORMAL HIGH

## 2015-12-28 ENCOUNTER — Other Ambulatory Visit: Payer: Self-pay | Admitting: Certified Nurse Midwife

## 2015-12-28 DIAGNOSIS — O98811 Other maternal infectious and parasitic diseases complicating pregnancy, first trimester: Principal | ICD-10-CM

## 2015-12-28 DIAGNOSIS — A749 Chlamydial infection, unspecified: Secondary | ICD-10-CM

## 2015-12-28 DIAGNOSIS — A5901 Trichomonal vulvovaginitis: Secondary | ICD-10-CM

## 2015-12-28 LAB — SURESWAB, VAGINOSIS/VAGINITIS PLUS
Atopobium vaginae: 5.8 Log (cells/mL)
BV CATEGORY: UNDETERMINED — AB
C. TROPICALIS, DNA: NOT DETECTED
C. albicans, DNA: NOT DETECTED
C. glabrata, DNA: NOT DETECTED
C. parapsilosis, DNA: NOT DETECTED
C. trachomatis RNA, TMA: DETECTED — AB
LACTOBACILLUS SPECIES: 6.1 Log (cells/mL)
MEGASPHAERA SPECIES: 8 Log (cells/mL)
N. gonorrhoeae RNA, TMA: NOT DETECTED
T. vaginalis RNA, QL TMA: DETECTED — AB

## 2015-12-28 MED ORDER — AZITHROMYCIN 250 MG PO TABS: ORAL_TABLET | ORAL | Status: DC

## 2015-12-28 MED ORDER — METRONIDAZOLE 500 MG PO TABS: 2000.0000 mg | ORAL_TABLET | Freq: Once | ORAL | Status: DC

## 2015-12-28 MED ORDER — METRONIDAZOLE 500 MG PO TABS: 500.0000 mg | ORAL_TABLET | Freq: Two times a day (BID) | ORAL | Status: DC

## 2016-01-20 ENCOUNTER — Encounter: Payer: Medicaid Other | Admitting: Certified Nurse Midwife

## 2016-01-20 ENCOUNTER — Ambulatory Visit (INDEPENDENT_AMBULATORY_CARE_PROVIDER_SITE_OTHER): Payer: Medicaid Other | Admitting: Certified Nurse Midwife

## 2016-01-20 VITALS — BP 136/81 | HR 120 | Temp 99.0°F | Wt 127.0 lb

## 2016-01-20 DIAGNOSIS — Z3482 Encounter for supervision of other normal pregnancy, second trimester: Secondary | ICD-10-CM | POA: Diagnosis not present

## 2016-01-20 DIAGNOSIS — Z3480 Encounter for supervision of other normal pregnancy, unspecified trimester: Secondary | ICD-10-CM | POA: Diagnosis not present

## 2016-01-20 LAB — POCT URINALYSIS DIPSTICK
Bilirubin, UA: NEGATIVE
Glucose, UA: NEGATIVE
KETONES UA: NEGATIVE
Leukocytes, UA: NEGATIVE
Nitrite, UA: NEGATIVE
Protein, UA: NEGATIVE
RBC UA: NEGATIVE
SPEC GRAV UA: 1.01
UROBILINOGEN UA: NEGATIVE
pH, UA: 6

## 2016-01-20 NOTE — Progress Notes (Signed)
  Subjective:    Debbie Wood is a 25 y.o. female being seen today for her obstetrical visit. She is at [redacted]w[redacted]d gestation. Patient reports: no complaints.  Problem List Items Addressed This Visit    None    Visit Diagnoses    Supervision of other normal pregnancy, antepartum, second trimester    -  Primary    Relevant Orders    POCT urinalysis dipstick    AFP, Quad Screen    US OB Comp + 14 Wk      Patient Active Problem List   Diagnosis Date Noted  . Encounter for supervision of other normal pregnancy in first trimester 12/23/2015  . Heart murmur   . Chest pain   . SOB (shortness of breath)     Objective:     BP 136/81 mmHg  Pulse 120  Temp(Src) 99 F (37.2 C)  Wt 127 lb (57.607 kg)  LMP 09/30/2015 Uterine Size: Below umbilicus   FHR: 155  Assessment:    Pregnancy @ [redacted]w[redacted]d  weeks Doing well    Plan:    Problem list reviewed and updated. Labs reviewed.  Follow up in 4 weeks. FIRST/CF mutation testing/NIPT/QUAD SCREEN/fragile X/Ashkenazi Jewish population testing/Spinal muscular atrophy discussed: ordered. Role of ultrasound in pregnancy discussed; fetal survey: ordered. Amniocentesis discussed: not indicated. 50% of 15 minute visit spent on counseling and coordination of care.

## 2016-01-23 LAB — AFP, QUAD SCREEN
AFP: 38.7 ng/mL
Age Alone: 1:979 {titer}
CURR GEST AGE: 16 wks.days
Down Syndrome Scr Risk Est: 1:33600 {titer}
HCG, Total: 16.76 IU/mL
INH: 199.4 pg/mL
INTERPRETATION-AFP: NEGATIVE
MOM FOR INH: 1.07
MoM for AFP: 0.93
MoM for hCG: 0.34
Open Spina bifida: NEGATIVE
Osb Risk: 1:34300 {titer}
Tri 18 Scr Risk Est: NEGATIVE
UE3 MOM: 0.59
UE3 VALUE: 0.5 ng/mL

## 2016-01-26 ENCOUNTER — Ambulatory Visit: Payer: Medicaid Other | Admitting: Internal Medicine

## 2016-02-16 ENCOUNTER — Ambulatory Visit (INDEPENDENT_AMBULATORY_CARE_PROVIDER_SITE_OTHER): Payer: Medicaid Other | Admitting: Certified Nurse Midwife

## 2016-02-16 ENCOUNTER — Ambulatory Visit (INDEPENDENT_AMBULATORY_CARE_PROVIDER_SITE_OTHER): Payer: Medicaid Other

## 2016-02-16 VITALS — BP 133/85 | HR 120 | Wt 140.0 lb

## 2016-02-16 DIAGNOSIS — Z3482 Encounter for supervision of other normal pregnancy, second trimester: Secondary | ICD-10-CM

## 2016-02-16 DIAGNOSIS — Z3492 Encounter for supervision of normal pregnancy, unspecified, second trimester: Secondary | ICD-10-CM

## 2016-02-16 DIAGNOSIS — Z36 Encounter for antenatal screening of mother: Secondary | ICD-10-CM

## 2016-02-16 LAB — POCT URINALYSIS DIPSTICK
Bilirubin, UA: NEGATIVE
Blood, UA: NEGATIVE
Glucose, UA: NEGATIVE
Ketones, UA: NEGATIVE
Leukocytes, UA: NEGATIVE
Nitrite, UA: NEGATIVE
PROTEIN UA: NEGATIVE
Spec Grav, UA: 1.02
UROBILINOGEN UA: NEGATIVE
pH, UA: 6

## 2016-02-16 NOTE — Progress Notes (Signed)
Patient is doing well- she had her US today and reports she is having a boy

## 2016-02-16 NOTE — Progress Notes (Signed)
Subjective:    Debbie Wood is a 25 y.o. female being seen today for her obstetrical visit. She is at 2918w6d gestation. Patient reports: no complaints . Fetal movement: normal.  TOC today.   Problem List Items Addressed This Visit    None    Visit Diagnoses    Prenatal care, second trimester    -  Primary    Relevant Orders    POCT urinalysis dipstick (Completed)      Patient Active Problem List   Diagnosis Date Noted  . Encounter for supervision of other normal pregnancy in first trimester 12/23/2015  . Heart murmur   . Chest pain   . SOB (shortness of breath)    Objective:    BP 133/85 mmHg  Pulse 120  Wt 140 lb (63.504 kg)  LMP 09/30/2015 FHT: 155 BPM  Uterine Size: size greater than dates     Assessment:    Pregnancy @ 318w6d    Plan:    OBGCT: discussed. Signs and symptoms of preterm labor: discussed.  Labs, problem list reviewed and updated 2 hr GTT planned Follow up in 4 weeks.

## 2016-02-20 LAB — NUSWAB VAGINITIS PLUS (VG+)
CANDIDA ALBICANS, NAA: NEGATIVE
CANDIDA GLABRATA, NAA: NEGATIVE
CHLAMYDIA TRACHOMATIS, NAA: NEGATIVE
NEISSERIA GONORRHOEAE, NAA: NEGATIVE
TRICH VAG BY NAA: NEGATIVE

## 2016-03-01 ENCOUNTER — Ambulatory Visit (INDEPENDENT_AMBULATORY_CARE_PROVIDER_SITE_OTHER): Payer: Medicaid Other | Admitting: Cardiology

## 2016-03-01 ENCOUNTER — Encounter: Payer: Self-pay | Admitting: Cardiology

## 2016-03-01 VITALS — BP 120/65 | HR 93 | Ht 62.0 in | Wt 141.4 lb

## 2016-03-01 DIAGNOSIS — R011 Cardiac murmur, unspecified: Secondary | ICD-10-CM | POA: Diagnosis not present

## 2016-03-01 NOTE — Progress Notes (Signed)
Cardiology Office Note   Date:  03/01/2016   ID:  Debbie Wood, DOB 08/25/1991, MRN [id number]  PCP:  No PCP Per Patient    No chief complaint on file.     History of Present Illness: Debbie Wood is a 25 y.o. female who presents for evaluation of chest pain, SOB and heart murmur.  She is [redacted] weeks pregnant. She states that she has a history of heart murmur that was present prior to her getting pregnant and was diagnosed at age 25 with heart murmur.  She says that a year ago she had some chest pain and SOB and was diagnosed by her PCP with anxiety and that has resolved with no recurrent problems.  She denies any chest pain, SOB, DOE, LE edema, dizziness or syncope.  Occasionally she will feel her heart beating faster than usual but she is drinking cofee.      Past Medical History  Diagnosis Date  . Heart murmur   . Chest pain   . SOB (shortness of breath)     Past Surgical History  Procedure Laterality Date  . No past surgeries       Current Outpatient Prescriptions  Medication Sig Dispense Refill  . Doxylamine-Pyridoxine (DICLEGIS) 10-10 MG TBEC Take 1 tablet with breakfast and lunch.  Take 2 tablets at bedtime. 100 tablet 4  . Prenat w/o A Vit-FeFum-FePo-FA (CONCEPT OB) 130-92.4-1 MG CAPS Take 1 tablet by mouth daily. 30 capsule 12  . [DISCONTINUED] norgestrel-ethinyl estradiol (LO/OVRAL,CRYSELLE) 0.3-30 MG-MCG tablet Take 1 tablet by mouth daily.     No current facility-administered medications for this visit.    Allergies:   Review of patient's allergies indicates no known allergies.    Social History:  The patient  reports that she quit smoking about 6 years ago. Her smoking use included Cigarettes. She does not have any smokeless tobacco history on file. She reports that she does not drink alcohol or use illicit drugs.   Family History:  The patient's family history includes Hyperlipidemia in her mother; Hypertension in her father  and mother. There is no history of Heart attack.    ROS:  Please see the history of present illness.   Otherwise, review of systems are positive for none.   All other systems are reviewed and negative.    PHYSICAL EXAM: VS:  BP 120/65 mmHg  Pulse 93  Ht 5\' 2"  (1.575 m)  Wt 141 lb 6.4 oz (64.139 kg)  BMI 25.86 kg/m2  LMP 09/30/2015 , BMI Body mass index is 25.86 kg/(m^2). GEN: Well nourished, well developed, in no acute distress HEENT: normal Neck: no JVD, carotid bruits, or masses Cardiac: RRR; no murmurs, rubs, or gallops,no edema  Respiratory:  clear to auscultation bilaterally, normal work of breathing GI: soft, nontender, nondistended, + BS MS: no deformity or atrophy Skin: warm and dry, no rash Neuro:  Strength and sensation are intact Psych: euthymic mood, full affect   EKG:  EKG is ordered today. The ekg ordered today demonstrates NSR with no ST changes   Recent Labs: 12/23/2015: Hemoglobin 11.9*; Platelets 339; TSH 0.456    Lipid Panel No results found for: CHOL, TRIG, HDL, CHOLHDL, VLDL, LDLCALC, LDLDIRECT    Wt Readings from Last 3 Encounters:  03/01/16 141 lb 6.4 oz (64.139 kg)  02/16/16 140 lb (63.504 kg)  01/20/16 127 lb (57.607 kg)  ASSESSMENT AND PLAN:  1.  Heart murmur - suspect this is a flow murmur of pregnancy but will get a 2D echo to evaluate especially in light of diagnosis of heart murmur at an early age.  She is completely asymptomatic    Current medicines are reviewed at length with the patient today.  The patient has no concerns regarding medicines.  The following changes have been made:  no change  Labs/ tests ordered today: See above Assessment and Plan No orders of the defined types were placed in this encounter.     Disposition:   FU with me PRN pending results of studies   SignedQuintella Reichert, MD  03/01/2016 9:38 AM    [facility] [address] [place], [place], [place]  [address] Phone: [phone]; Fax: [phone]

## 2016-03-01 NOTE — Patient Instructions (Signed)
Medication Instructions:  Your physician recommends that you continue on your current medications as directed. Please refer to the Current Medication list given to you today.   Labwork: None  Testing/Procedures: Your physician has requested that you have an echocardiogram. Echocardiography is a painless test that uses sound waves to create images of your heart. It provides your doctor with information about the size and shape of your heart and how well your heart's chambers and valves are working. This procedure takes approximately one hour. There are no restrictions for this procedure.  Follow-Up: Your physician recommends that you schedule a follow-up appointment AS NEEDED with Dr. Turner pending study results.  Any Other Special Instructions Will Be Listed Below (If Applicable).     If you need a refill on your cardiac medications before your next appointment, please call your pharmacy.   

## 2016-03-15 ENCOUNTER — Ambulatory Visit (INDEPENDENT_AMBULATORY_CARE_PROVIDER_SITE_OTHER): Payer: Medicaid Other | Admitting: Certified Nurse Midwife

## 2016-03-15 VITALS — BP 126/83 | HR 117 | Temp 98.7°F | Wt 140.0 lb

## 2016-03-15 DIAGNOSIS — Z3482 Encounter for supervision of other normal pregnancy, second trimester: Secondary | ICD-10-CM

## 2016-03-15 LAB — POCT URINALYSIS DIPSTICK
BILIRUBIN UA: NEGATIVE
Blood, UA: NEGATIVE
Glucose, UA: NEGATIVE
KETONES UA: NEGATIVE
Leukocytes, UA: NEGATIVE
Nitrite, UA: NEGATIVE
PH UA: 6
PROTEIN UA: NEGATIVE
SPEC GRAV UA: 1.01
Urobilinogen, UA: NEGATIVE

## 2016-03-15 MED ORDER — VITAFOL GUMMIES 3.33-0.333-34.8 MG PO CHEW: 3.0000 | CHEWABLE_TABLET | Freq: Every day | ORAL | Status: DC

## 2016-03-15 NOTE — Progress Notes (Signed)
Patient has no concerns 

## 2016-03-15 NOTE — Patient Instructions (Signed)
Glucose Tolerance Test During Pregnancy The glucose tolerance test (GTT) is a blood test used to determine if you have developed a type of diabetes during pregnancy (gestational diabetes). This is when your body does not properly process sugar (glucose) in the food you eat, resulting in high blood glucose levels. Typically, a GTT is done after you have had a 1-hour glucose test with results that indicate you possibly have gestational diabetes. It may also be done if:  You have a history of giving birth to very large babies or have experienced repeated fetal loss (stillbirth).   You have signs and symptoms of diabetes, such as:   Changes in your vision.   Tingling or numbness in your hands or feet.   Changes in hunger, thirst, and urination not otherwise explained by your pregnancy.  The GTT lasts about 3 hours. You will be given a sugar-water solution to drink at the beginning of the test. You will have blood drawn before you drink the solution and then again 1, 2, and 3 hours after you drink it. You will not be allowed to eat or drink anything else during the test. You must remain at the testing location to make sure that your blood is drawn on time. You should also avoid exercising during the test, because exercise can alter test results. PREPARATION FOR TEST  Eat normally for 3 days prior to the GTT test, including having plenty of carbohydrate-rich foods. Do not eat or drink anything except water during the final 12 hours before the test. In addition, your health care provider may ask you to stop taking certain medicines before the test. RESULTS  It is your responsibility to obtain your test results. Ask the lab or department performing the test when and how you will get your results. Contact your health care provider to discuss any questions you have about your results.  Range of Normal Values Ranges for normal values may vary among different labs and hospitals. You should always check  with your health care provider after having lab work or other tests done to discuss whether your values are considered within normal limits. Normal levels of blood glucose are as follows:  Fasting: less than 105 mg/dL.   1 hour after drinking the solution: less than 190 mg/dL.   2 hours after drinking the solution: less than 165 mg/dL.   3 hours after drinking the solution: less than 145 mg/dL.  Some substances can interfere with GTT results. These may include:  Blood pressure and heart failure medicines, including beta blockers, furosemide, and thiazides.   Anti-inflammatory medicines, including aspirin.   Nicotine.   Some psychiatric medicines.  Meaning of Results Outside Normal Value Ranges GTT test results that are above normal values may indicate a number of health problems, such as:   Gestational diabetes.   Acute stress response.   Cushing syndrome.   Tumors such as pheochromocytoma or glucagonoma.   Long-term kidney problems.   Pancreatitis.   Hyperthyroidism.   Current infection.  Discuss your test results with your health care provider. He or she will use the results to make a diagnosis and determine a treatment plan that is right for you.   This information is not intended to replace advice given to you by your health care provider. Make sure you discuss any questions you have with your health care provider.   Document Released: 05/20/2012 Document Revised: 12/10/2014 Document Reviewed: 03/26/2014 Elsevier Interactive Patient Education 2016 Else[employer] Inc. Heart Murmur A heart murmur  is an extra sound heard by your health care provider when listening to your heart with a device called a stethoscope. The sound comes from turbulence when blood flows through the heart and may be a "hum" or "whoosh" sound heard when the heart beats. There are two types of heart murmurs:  Innocent murmurs. Most people with this type of heart murmur do not have a  heart problem. Many children have innocent heart murmurs. Your health care provider may suggest some basic testing to know whether your murmur is an innocent murmur. If an innocent heart murmur is found, there is no need for further tests or treatment and no need to restrict activities or stop playing sports.  Abnormal murmurs. These types of murmurs can occur in children and adults. In children, abnormal heart murmurs are typically caused from heart defects that are present at birth (congenital). In adults, abnormal murmurs are usually from heart valve problems caused by disease, infection, or aging. CAUSES  Normally, these valves open to let blood flow through or out of your heart and then shut to keep it from flowing backward. If they do not work properly, you could have:  Regurgitation--When blood leaks back through the valve in the wrong direction.  Mitral valve prolapse--When the mitral valve of the heart has a loose flap and does not close tightly.  Stenosis--When the valve does not open enough and blocks blood flow. SIGNS AND SYMPTOMS  Innocent murmurs do not cause symptoms, and many people with abnormal murmurs may or may not have symptoms. If symptoms do develop, they may include:  Shortness of breath.  Blue coloring of the skin, especially on the fingertips.  Chest pain.  Palpitations, or feeling a fluttering or skipped heartbeat.  Fainting.  Persistent cough.  Getting tired much faster than expected. DIAGNOSIS  A heart murmur might be heard during a sports physical or during any type of examination. When a murmur is heard, it may suggest a possible problem. When this happens, your health care provider may ask you to see a heart specialist (cardiologist). You may also be asked to have one or more heart tests. In these cases, testing may vary depending on what your health care provider heard. Tests for a heart murmur may  include:  Electrocardiogram.  Echocardiogram.  MRI. For children and adults who have an abnormal heart murmur and want to play sports, it is important to complete testing, review test results, and receive recommendations from your health care provider. If heart disease is present, it may not be safe to play. TREATMENT  Innocent murmurs require no treatment or activity restriction. If an abnormal murmur represents a problem with the heart, treatment will depend on the exact nature of the problem. In these cases, medicine or surgery may be needed to treat the problem. HOME CARE INSTRUCTIONS If you want to participate in sports or other types of strenuous physical activity, it is important to discuss this first with your health care provider. If the murmur represents a problem with the heart and you choose to participate in sports, there is a small chance that a serious problem (including sudden death) could result.  SEEK MEDICAL CARE IF:   You feel that your symptoms are slowly worsening.  You develop any new symptoms that cause concern.  You feel that you are having side effects from any medicines prescribed. SEEK IMMEDIATE MEDICAL CARE IF:   You develop chest pain.  You have shortness of breath.  You notice that your  heart beats irregularly often enough to cause you to worry.  You have fainting spells.  Your symptoms suddenly get worse.   This information is not intended to replace advice given to you by your health care provider. Make sure you discuss any questions you have with your health care provider.   Document Released: 12/27/2004 Document Revised: 12/10/2014 Document Reviewed: 07/27/2013 Elsevier Interactive Patient Education 2016 [employer]. Second Trimester of Pregnancy The second trimester is from week 13 through week 28, months 4 through 6. The second trimester is often a time when you feel your best. Your body has also adjusted to being pregnant, and you begin to  feel better physically. Usually, morning sickness has lessened or quit completely, you may have more energy, and you may have an increase in appetite. The second trimester is also a time when the fetus is growing rapidly. At the end of the sixth month, the fetus is about 9 inches long and weighs about 1 pounds. You will likely begin to feel the baby move (quickening) between 18 and 20 weeks of the pregnancy. BODY CHANGES Your body goes through many changes during pregnancy. The changes vary from woman to woman.   Your weight will continue to increase. You will notice your lower abdomen bulging out.  You may begin to get stretch marks on your hips, abdomen, and breasts.  You may develop headaches that can be relieved by medicines approved by your health care provider.  You may urinate more often because the fetus is pressing on your bladder.  You may develop or continue to have heartburn as a result of your pregnancy.  You may develop constipation because certain hormones are causing the muscles that push waste through your intestines to slow down.  You may develop hemorrhoids or swollen, bulging veins (varicose veins).  You may have back pain because of the weight gain and pregnancy hormones relaxing your joints between the bones in your pelvis and as a result of a shift in weight and the muscles that support your balance.  Your breasts will continue to grow and be tender.  Your gums may bleed and may be sensitive to brushing and flossing.  Dark spots or blotches (chloasma, mask of pregnancy) may develop on your face. This will likely fade after the baby is born.  A dark line from your belly button to the pubic area (linea nigra) may appear. This will likely fade after the baby is born.  You may have changes in your hair. These can include thickening of your hair, rapid growth, and changes in texture. Some women also have hair loss during or after pregnancy, or hair that feels dry or  thin. Your hair will most likely return to normal after your baby is born. WHAT TO EXPECT AT YOUR PRENATAL VISITS During a routine prenatal visit:  You will be weighed to make sure you and the fetus are growing normally.  Your blood pressure will be taken.  Your abdomen will be measured to track your baby's growth.  The fetal heartbeat will be listened to.  Any test results from the previous visit will be discussed. Your health care provider may ask you:  How you are feeling.  If you are feeling the baby move.  If you have had any abnormal symptoms, such as leaking fluid, bleeding, severe headaches, or abdominal cramping.  If you are using any tobacco products, including cigarettes, chewing tobacco, and electronic cigarettes.  If you have any questions. Other tests that  may be performed during your second trimester include:  Blood tests that check for:  Low iron levels (anemia).  Gestational diabetes (between 24 and 28 weeks).  Rh antibodies.  Urine tests to check for infections, diabetes, or protein in the urine.  An ultrasound to confirm the proper growth and development of the baby.  An amniocentesis to check for possible genetic problems.  Fetal screens for spina bifida and Down syndrome.  HIV (human immunodeficiency virus) testing. Routine prenatal testing includes screening for HIV, unless you choose not to have this test. HOME CARE INSTRUCTIONS   Avoid all smoking, herbs, alcohol, and unprescribed drugs. These chemicals affect the formation and growth of the baby.  Do not use any tobacco products, including cigarettes, chewing tobacco, and electronic cigarettes. If you need help quitting, ask your health care provider. You may receive counseling support and other resources to help you quit.  Follow your health care provider's instructions regarding medicine use. There are medicines that are either safe or unsafe to take during pregnancy.  Exercise only as  directed by your health care provider. Experiencing uterine cramps is a good sign to stop exercising.  Continue to eat regular, healthy meals.  Wear a good support bra for breast tenderness.  Do not use hot tubs, steam rooms, or saunas.  Wear your seat belt at all times when driving.  Avoid raw meat, uncooked cheese, cat litter boxes, and soil used by cats. These carry germs that can cause birth defects in the baby.  Take your prenatal vitamins.  Take 1500-2000 mg of calcium daily starting at the 20th week of pregnancy until you deliver your baby.  Try taking a stool softener (if your health care provider approves) if you develop constipation. Eat more high-fiber foods, such as fresh vegetables or fruit and whole grains. Drink plenty of fluids to keep your urine clear or pale yellow.  Take warm sitz baths to soothe any pain or discomfort caused by hemorrhoids. Use hemorrhoid cream if your health care provider approves.  If you develop varicose veins, wear support hose. Elevate your feet for 15 minutes, 3-4 times a day. Limit salt in your diet.  Avoid heavy lifting, wear low heel shoes, and practice good posture.  Rest with your legs elevated if you have leg cramps or low back pain.  Visit your dentist if you have not gone yet during your pregnancy. Use a soft toothbrush to brush your teeth and be gentle when you floss.  A sexual relationship may be continued unless your health care provider directs you otherwise.  Continue to go to all your prenatal visits as directed by your health care provider. SEEK MEDICAL CARE IF:   You have dizziness.  You have mild pelvic cramps, pelvic pressure, or nagging pain in the abdominal area.  You have persistent nausea, vomiting, or diarrhea.  You have a bad smelling vaginal discharge.  You have pain with urination. SEEK IMMEDIATE MEDICAL CARE IF:   You have a fever.  You are leaking fluid from your vagina.  You have spotting or  bleeding from your vagina.  You have severe abdominal cramping or pain.  You have rapid weight gain or loss.  You have shortness of breath with chest pain.  You notice sudden or extreme swelling of your face, hands, ankles, feet, or legs.  You have not felt your baby move in over an hour.  You have severe headaches that do not go away with medicine.  You have vision changes.  This information is not intended to replace advice given to you by your health care provider. Make sure you discuss any questions you have with your health care provider.   Document Released: 11/13/2001 Document Revised: 12/10/2014 Document Reviewed: 01/20/2013 Elsevier Interactive Patient Education [employer]2016 Elsevier Inc.

## 2016-03-15 NOTE — Progress Notes (Signed)
Subjective:    Debbie Wood is a 25 y.o. female being seen today for her obstetrical visit. She is at 6787w6d gestation. Patient reports: no complaints . Fetal movement: normal.  Problem List Items Addressed This Visit    None    Visit Diagnoses    Supervision of other normal pregnancy, antepartum, second trimester    -  Primary    Relevant Orders    POCT urinalysis dipstick (Completed)      Patient Active Problem List   Diagnosis Date Noted  . Encounter for supervision of other normal pregnancy in first trimester 12/23/2015  . Heart murmur    Objective:    BP 126/83 mmHg  Pulse 117  Temp(Src) 98.7 F (37.1 C)  Wt 140 lb (63.504 kg)  LMP 09/30/2015 FHT: 157 BPM  Uterine Size: 26 cm and size greater than dates     Assessment:    Pregnancy @ 3787w6d    Doing well  Plan:    OBGCT: discussed and ordered for next visit. Signs and symptoms of preterm labor: discussed.  Labs, problem list reviewed and updated 2 hr GTT planned Follow up in 4 weeks.

## 2016-03-16 ENCOUNTER — Other Ambulatory Visit (HOSPITAL_COMMUNITY): Payer: Medicaid Other

## 2016-03-20 ENCOUNTER — Other Ambulatory Visit: Payer: Self-pay | Admitting: Certified Nurse Midwife

## 2016-03-21 ENCOUNTER — Other Ambulatory Visit (HOSPITAL_COMMUNITY): Payer: Medicaid Other

## 2016-03-22 ENCOUNTER — Ambulatory Visit (HOSPITAL_COMMUNITY): Payer: Medicaid Other | Attending: Cardiology

## 2016-03-22 ENCOUNTER — Other Ambulatory Visit: Payer: Self-pay

## 2016-03-22 DIAGNOSIS — Z87891 Personal history of nicotine dependence: Secondary | ICD-10-CM | POA: Insufficient documentation

## 2016-03-22 DIAGNOSIS — I071 Rheumatic tricuspid insufficiency: Secondary | ICD-10-CM | POA: Diagnosis not present

## 2016-03-22 DIAGNOSIS — R011 Cardiac murmur, unspecified: Secondary | ICD-10-CM | POA: Diagnosis not present

## 2016-03-22 DIAGNOSIS — I340 Nonrheumatic mitral (valve) insufficiency: Secondary | ICD-10-CM | POA: Diagnosis not present

## 2016-03-23 ENCOUNTER — Telehealth: Payer: Self-pay

## 2016-03-23 DIAGNOSIS — I272 Pulmonary hypertension, unspecified: Secondary | ICD-10-CM

## 2016-03-23 DIAGNOSIS — I071 Rheumatic tricuspid insufficiency: Secondary | ICD-10-CM

## 2016-03-23 NOTE — Telephone Encounter (Signed)
Informed patient of results and verbal understanding expressed.  Repeat ECHO ordered to be scheduled in 1 year. Patient agrees with treatment plan. 

## 2016-03-23 NOTE — Telephone Encounter (Signed)
-----   Message from Quintella Reichertraci R Turner, MD sent at 03/22/2016  5:41 PM EDT ----- Echo showed normal LVF with mild TR with very mild pulmonary HTN - repeat echo in 1 year

## 2016-04-12 ENCOUNTER — Other Ambulatory Visit: Payer: Medicaid Other

## 2016-04-12 ENCOUNTER — Ambulatory Visit (INDEPENDENT_AMBULATORY_CARE_PROVIDER_SITE_OTHER): Payer: Medicaid Other | Admitting: Certified Nurse Midwife

## 2016-04-12 VITALS — BP 114/71 | HR 100 | Temp 97.7°F | Wt 149.0 lb

## 2016-04-12 DIAGNOSIS — N760 Acute vaginitis: Secondary | ICD-10-CM

## 2016-04-12 DIAGNOSIS — Z3492 Encounter for supervision of normal pregnancy, unspecified, second trimester: Secondary | ICD-10-CM

## 2016-04-12 DIAGNOSIS — Z9109 Other allergy status, other than to drugs and biological substances: Secondary | ICD-10-CM

## 2016-04-12 LAB — POCT URINALYSIS DIPSTICK
Bilirubin, UA: NEGATIVE
GLUCOSE UA: NEGATIVE
Ketones, UA: NEGATIVE
NITRITE UA: NEGATIVE
PH UA: 6
RBC UA: NEGATIVE
SPEC GRAV UA: 1.01
UROBILINOGEN UA: NEGATIVE

## 2016-04-12 MED ORDER — FLUCONAZOLE 100 MG PO TABS: 100.0000 mg | ORAL_TABLET | Freq: Once | ORAL | Status: DC

## 2016-04-12 MED ORDER — TERCONAZOLE 0.4 % VA CREA: 1.0000 | TOPICAL_CREAM | Freq: Every day | VAGINAL | Status: DC

## 2016-04-12 MED ORDER — FLUTICASONE PROPIONATE 50 MCG/ACT NA SUSP
2.0000 | Freq: Every day | NASAL | Status: DC
Start: 2016-04-12 — End: 2016-05-10

## 2016-04-12 NOTE — Progress Notes (Signed)
Subjective:    Debbie Wood is a 25 y.o. female being seen today for her obstetrical visit. She is at 6934w6d gestation. Patient reports: no bleeding, no contractions, no cramping, no leaking, vaginal irritation and allergies, mostly nasal; is taking benadryl . Fetal movement: normal.  Problem List Items Addressed This Visit    None    Visit Diagnoses    Prenatal care, second trimester    -  Primary    Relevant Orders    POCT urinalysis dipstick (Completed)    Environmental allergies        Relevant Medications    fluticasone (FLONASE) 50 MCG/ACT nasal spray    Vaginitis        Relevant Medications    fluconazole (DIFLUCAN) 100 MG tablet    terconazole (TERAZOL 7) 0.4 % vaginal cream      Patient Active Problem List   Diagnosis Date Noted  . Encounter for supervision of other normal pregnancy in first trimester 12/23/2015  . Heart murmur    Objective:    BP 114/71 mmHg  Pulse 100  Temp(Src) 97.7 F (36.5 C)  Wt 149 lb (67.586 kg)  LMP 09/30/2015 FHT: 150 BPM  Uterine Size: size equals dates     Assessment:    Pregnancy @ 3934w6d    Environmental allergies  Vaginitis  Plan:    2 hour OGTT today.   OBGCT: ordered. Signs and symptoms of preterm labor: discussed.  Labs, problem list reviewed and updated 2 hr GTT planned Follow up in 2 weeks.

## 2016-04-12 NOTE — Addendum Note (Signed)
Addended by: Marya LandryFOSTER, SUZANNE D on: 04/12/2016 12:24 PM   Modules accepted: Orders

## 2016-04-12 NOTE — Progress Notes (Signed)
Patient has no questions  Or concerns today

## 2016-04-13 ENCOUNTER — Other Ambulatory Visit: Payer: Self-pay | Admitting: Certified Nurse Midwife

## 2016-04-13 DIAGNOSIS — O99013 Anemia complicating pregnancy, third trimester: Secondary | ICD-10-CM

## 2016-04-13 LAB — CBC
HEMATOCRIT: 27.9 % — AB (ref 34.0–46.6)
HEMOGLOBIN: 9.4 g/dL — AB (ref 11.1–15.9)
MCH: 29.6 pg (ref 26.6–33.0)
MCHC: 33.7 g/dL (ref 31.5–35.7)
MCV: 88 fL (ref 79–97)
Platelets: 326 10*3/uL (ref 150–379)
RBC: 3.18 x10E6/uL — ABNORMAL LOW (ref 3.77–5.28)
RDW: 13.1 % (ref 12.3–15.4)
WBC: 13.5 10*3/uL — ABNORMAL HIGH (ref 3.4–10.8)

## 2016-04-13 LAB — GLUCOSE TOLERANCE, 2 HOURS W/ 1HR
Glucose, 1 hour: 80 mg/dL (ref 65–179)
Glucose, 2 hour: 90 mg/dL (ref 65–152)
Glucose, Fasting: 81 mg/dL (ref 65–91)

## 2016-04-13 LAB — RPR: RPR: NONREACTIVE

## 2016-04-13 LAB — HIV ANTIBODY (ROUTINE TESTING W REFLEX): HIV Screen 4th Generation wRfx: NONREACTIVE

## 2016-04-13 MED ORDER — IRON POLYSACCH CMPLX-B12-FA 150-0.025-1 MG PO CAPS: 1.0000 | ORAL_CAPSULE | Freq: Every day | ORAL | Status: DC

## 2016-04-16 ENCOUNTER — Encounter: Payer: Self-pay | Admitting: *Deleted

## 2016-04-26 ENCOUNTER — Encounter: Payer: Self-pay | Admitting: Certified Nurse Midwife

## 2016-04-26 ENCOUNTER — Ambulatory Visit (INDEPENDENT_AMBULATORY_CARE_PROVIDER_SITE_OTHER): Payer: Medicaid Other | Admitting: Certified Nurse Midwife

## 2016-04-26 VITALS — BP 119/79 | HR 101 | Wt 151.0 lb

## 2016-04-26 DIAGNOSIS — Z3483 Encounter for supervision of other normal pregnancy, third trimester: Secondary | ICD-10-CM

## 2016-04-26 LAB — POCT URINALYSIS DIPSTICK
Bilirubin, UA: NEGATIVE
GLUCOSE UA: NEGATIVE
Ketones, UA: NEGATIVE
LEUKOCYTES UA: NEGATIVE
NITRITE UA: NEGATIVE
RBC UA: NEGATIVE
Spec Grav, UA: 1.015
UROBILINOGEN UA: NEGATIVE
pH, UA: 6

## 2016-04-26 NOTE — Progress Notes (Signed)
Subjective:    Arlynn Chipper Herb Jun is a 25 y.o. female being seen today for her obstetrical visit. She is at 7694w6d gestation. Patient reports no complaints. Fetal movement: normal.  Problem List Items Addressed This Visit    None    Visit Diagnoses    Encounter for supervision of other normal pregnancy in third trimester    -  Primary    Relevant Orders    POCT urinalysis dipstick (Completed)      Patient Active Problem List   Diagnosis Date Noted  . Encounter for supervision of other normal pregnancy in first trimester 12/23/2015  . Heart murmur    Objective:    BP 119/79 mmHg  Pulse 101  Wt 151 lb (68.493 kg)  LMP 09/30/2015 FHT:  140 BPM  Uterine Size: size equals dates  Presentation: cephalic     Assessment:    Pregnancy @ 3994w6d weeks   doing well  Plan:     labs reviewed, problem list updated Consent signed. GBS sent TDAP offered  Rhogam given for RH negative Pediatrician: discussed. Infant feeding: plans to breastfeed. Maternity leave: discussed. Cigarette smoking: never smoked. Orders Placed This Encounter  Procedures  . POCT urinalysis dipstick   No orders of the defined types were placed in this encounter.   Follow up in 2 Weeks.

## 2016-05-10 ENCOUNTER — Encounter: Payer: Medicaid Other | Admitting: Certified Nurse Midwife

## 2016-05-10 ENCOUNTER — Ambulatory Visit (INDEPENDENT_AMBULATORY_CARE_PROVIDER_SITE_OTHER): Payer: Medicaid Other | Admitting: Certified Nurse Midwife

## 2016-05-10 VITALS — BP 131/86 | HR 102 | Temp 98.5°F | Wt 158.0 lb

## 2016-05-10 DIAGNOSIS — Z3492 Encounter for supervision of normal pregnancy, unspecified, second trimester: Secondary | ICD-10-CM

## 2016-05-10 LAB — POCT URINALYSIS DIPSTICK
Bilirubin, UA: NEGATIVE
Blood, UA: NEGATIVE
GLUCOSE UA: NEGATIVE
Ketones, UA: NEGATIVE
LEUKOCYTES UA: NEGATIVE
NITRITE UA: NEGATIVE
PROTEIN UA: NEGATIVE
SPEC GRAV UA: 1.015
UROBILINOGEN UA: NEGATIVE
pH, UA: 7

## 2016-05-10 NOTE — Progress Notes (Signed)
Subjective:    Debbie Wood is a 25 y.o. female being seen today for her obstetrical visit. She is at 6843w6d gestation. Patient reports no complaints. Fetal movement: normal.  Problem List Items Addressed This Visit    None    Visit Diagnoses    Prenatal care, second trimester    -  Primary    Relevant Orders    POCT urinalysis dipstick (Completed)      Patient Active Problem List   Diagnosis Date Noted  . Encounter for supervision of other normal pregnancy in first trimester 12/23/2015  . Heart murmur    Objective:    BP 131/86 mmHg  Pulse 102  Temp(Src) 98.5 F (36.9 C)  Wt 158 lb (71.668 kg)  LMP 09/30/2015 FHT:  148 BPM  Uterine Size: size equals dates  Presentation: cephalic     Assessment:    Pregnancy @ 1143w6d weeks   Doing well.   Plan:     labs reviewed, problem list updated Consent signed. GBS planning TDAP offered  Rhogam given for RH negative Pediatrician: discussed. Infant feeding: plans to breastfeed. Maternity leave: discussed. Cigarette smoking: never smoked. Orders Placed This Encounter  Procedures  . POCT urinalysis dipstick   No orders of the defined types were placed in this encounter.   Follow up in 2 Weeks.

## 2016-05-25 ENCOUNTER — Ambulatory Visit (INDEPENDENT_AMBULATORY_CARE_PROVIDER_SITE_OTHER): Payer: Medicaid Other | Admitting: Certified Nurse Midwife

## 2016-05-25 VITALS — BP 117/80 | HR 106 | Temp 98.9°F | Wt 158.0 lb

## 2016-05-25 DIAGNOSIS — Z3493 Encounter for supervision of normal pregnancy, unspecified, third trimester: Secondary | ICD-10-CM

## 2016-05-25 LAB — POCT URINALYSIS DIPSTICK
Bilirubin, UA: NEGATIVE
Blood, UA: NEGATIVE
GLUCOSE UA: NEGATIVE
Ketones, UA: NEGATIVE
LEUKOCYTES UA: NEGATIVE
NITRITE UA: NEGATIVE
Spec Grav, UA: 1.01
UROBILINOGEN UA: NEGATIVE
pH, UA: 6

## 2016-05-25 NOTE — Progress Notes (Signed)
Subjective:    Debbie Wood is a 25 y.o. female being seen today for her obstetrical visit. She is at 2964w0d gestation. Patient reports no complaints. Fetal movement: normal.  Problem List Items Addressed This Visit    None    Visit Diagnoses    Prenatal care, third trimester    -  Primary    Relevant Orders    POCT urinalysis dipstick (Completed)      Patient Active Problem List   Diagnosis Date Noted  . Encounter for supervision of other normal pregnancy in first trimester 12/23/2015  . Heart murmur    Objective:    BP 117/80 mmHg  Pulse 106  Temp(Src) 98.9 F (37.2 C)  Wt 158 lb (71.668 kg)  LMP 09/30/2015 FHT:  155 BPM  Uterine Size: size equals dates  Presentation: cephalic     Assessment:    Pregnancy @ 1664w0d weeks   Doing well  Plan:     labs reviewed, problem list updated Consent signed. GBS planning TDAP offered  Rhogam given for RH negative Pediatrician: discussed. Infant feeding: plans to breastfeed. Maternity leave: discussed. Cigarette smoking: never smoked. Orders Placed This Encounter  Procedures  . POCT urinalysis dipstick   No orders of the defined types were placed in this encounter.   Follow up in 1 Week with GBS.

## 2016-05-30 ENCOUNTER — Ambulatory Visit (INDEPENDENT_AMBULATORY_CARE_PROVIDER_SITE_OTHER): Payer: Medicaid Other | Admitting: Certified Nurse Midwife

## 2016-05-30 VITALS — BP 119/80 | HR 108 | Wt 157.0 lb

## 2016-05-30 DIAGNOSIS — Z3483 Encounter for supervision of other normal pregnancy, third trimester: Secondary | ICD-10-CM

## 2016-05-30 LAB — POCT URINALYSIS DIPSTICK
GLUCOSE UA: NEGATIVE
Spec Grav, UA: <=1.005
pH, UA: 7

## 2016-05-30 LAB — OB RESULTS CONSOLE GBS: GBS: NEGATIVE

## 2016-05-30 NOTE — Addendum Note (Signed)
Addended by: Luella CookMCINTYRE, Jaylenne Hamelin E on: 05/30/2016 02:55 PM   Modules accepted: Orders

## 2016-05-30 NOTE — Addendum Note (Signed)
Addended by: Luella CookMCINTYRE, Maurita Havener E on: 05/30/2016 03:59 PM   Modules accepted: Orders

## 2016-05-30 NOTE — Progress Notes (Signed)
Subjective:    Debbie Wood is a 25 y.o. female being seen today for her obstetrical visit. She is at 1562w5d gestation. Patient reports no complaints. Fetal movement: normal.  [place]  Encouraged patient to get a copy of her medical records in case of preterm labor.   Problem List Items Addressed This Visit    None     Patient Active Problem List   Diagnosis Date Noted  . Encounter for supervision of other normal pregnancy in first trimester 12/23/2015  . Heart murmur    Objective:    BP 119/80 mmHg  Pulse 108  Wt 157 lb (71.215 kg)  LMP 09/30/2015 FHT:  150 BPM  Uterine Size: size equals dates  Presentation: cephalic   Cervix:  1cm dilated, anterior, soft, long, -3 station.   Assessment:    Pregnancy @ 7062w5d weeks   Doing well   Plan:     labs reviewed, problem list updated Consent signed. GBS sent TDAP offered  Rhogam given for RH negative Pediatrician: discussed. Infant feeding: plans to breastfeed. Maternity leave: N/A. Cigarette smoking: never smoked. No orders of the defined types were placed in this encounter.   No orders of the defined types were placed in this encounter.   Follow up in 1 Week.

## 2016-06-01 ENCOUNTER — Encounter: Payer: Medicaid Other | Admitting: Certified Nurse Midwife

## 2016-06-01 LAB — STREP GP B NAA: STREP GROUP B AG: NEGATIVE

## 2016-06-04 LAB — NUSWAB VG+, CANDIDA 6SP
CANDIDA KRUSEI, NAA: NEGATIVE
CANDIDA LUSITANIAE, NAA: NEGATIVE
CANDIDA PARAPSILOSIS, NAA: NEGATIVE
CHLAMYDIA TRACHOMATIS, NAA: POSITIVE — AB
Candida albicans, NAA: NEGATIVE
Candida glabrata, NAA: NEGATIVE
Candida tropicalis, NAA: NEGATIVE
NEISSERIA GONORRHOEAE, NAA: NEGATIVE
TRICH VAG BY NAA: NEGATIVE

## 2016-06-06 ENCOUNTER — Ambulatory Visit (INDEPENDENT_AMBULATORY_CARE_PROVIDER_SITE_OTHER): Payer: Medicaid Other | Admitting: Certified Nurse Midwife

## 2016-06-06 VITALS — BP 121/86 | HR 88 | Wt 161.0 lb

## 2016-06-06 DIAGNOSIS — A749 Chlamydial infection, unspecified: Secondary | ICD-10-CM

## 2016-06-06 DIAGNOSIS — O98813 Other maternal infectious and parasitic diseases complicating pregnancy, third trimester: Principal | ICD-10-CM

## 2016-06-06 DIAGNOSIS — O98313 Other infections with a predominantly sexual mode of transmission complicating pregnancy, third trimester: Secondary | ICD-10-CM

## 2016-06-06 LAB — POCT URINALYSIS DIPSTICK
BILIRUBIN UA: NEGATIVE
Blood, UA: NEGATIVE
GLUCOSE UA: NEGATIVE
KETONES UA: NEGATIVE
LEUKOCYTES UA: NEGATIVE
NITRITE UA: NEGATIVE
Protein, UA: NEGATIVE
Spec Grav, UA: <=1.005
Urobilinogen, UA: NEGATIVE
pH, UA: 7

## 2016-06-06 MED ORDER — AZITHROMYCIN 250 MG PO TABS: ORAL_TABLET | ORAL | Status: DC

## 2016-06-06 NOTE — Progress Notes (Signed)
Subjective:    Debbie Wood is a 25 y.o. female being seen today for her obstetrical visit. She is at 6138w5d gestation. Patient reports no complaints. Fetal movement: normal.  Problem List Items Addressed This Visit    None    Visit Diagnoses    Chlamydia infection affecting pregnancy in third trimester, antepartum    -  Primary    Relevant Medications    azithromycin (ZITHROMAX) 250 MG tablet      Patient Active Problem List   Diagnosis Date Noted  . Encounter for supervision of other normal pregnancy in first trimester 12/23/2015  . Heart murmur    Objective:    BP 121/86 mmHg  Pulse 88  Wt 161 lb (73.029 kg)  LMP 09/30/2015 FHT:  150 BPM  Uterine Size: size equals dates  Presentation: cephalic     Assessment:    Pregnancy @ 4638w5d weeks   +CH results discussed and treatment ordered  Plan:     labs reviewed, problem list updated Consent signed. GBS results reviewed TDAP offered  Rhogam given for RH negative Pediatrician: discussed. Infant feeding: plans to breastfeed. Maternity leave: discussed. Cigarette smoking: never smoked. No orders of the defined types were placed in this encounter.   Meds ordered this encounter  Medications  . azithromycin (ZITHROMAX) 250 MG tablet    Sig: Take 4 tablets all together now.    Dispense:  4 tablet    Refill:  0   Follow up in 1 Week.

## 2016-06-06 NOTE — Addendum Note (Signed)
Addended by: Marya LandryFOSTER, Grecia Lynk D on: 06/06/2016 05:42 PM   Modules accepted: Orders

## 2016-06-15 ENCOUNTER — Ambulatory Visit (INDEPENDENT_AMBULATORY_CARE_PROVIDER_SITE_OTHER): Payer: Medicaid Other | Admitting: Certified Nurse Midwife

## 2016-06-15 VITALS — BP 129/72 | HR 94 | Temp 97.9°F | Wt 162.7 lb

## 2016-06-15 DIAGNOSIS — Z3481 Encounter for supervision of other normal pregnancy, first trimester: Secondary | ICD-10-CM

## 2016-06-15 DIAGNOSIS — Z3493 Encounter for supervision of normal pregnancy, unspecified, third trimester: Secondary | ICD-10-CM

## 2016-06-15 LAB — POCT URINALYSIS DIPSTICK
BILIRUBIN UA: NEGATIVE
GLUCOSE UA: NORMAL
Ketones, UA: NEGATIVE
Leukocytes, UA: NEGATIVE
Nitrite, UA: NEGATIVE
RBC UA: NEGATIVE
Urobilinogen, UA: 1
pH, UA: 8

## 2016-06-15 NOTE — Progress Notes (Signed)
Subjective:    Debbie Wood is a 25 y.o. female being seen today for her obstetrical visit. She is at 5652w0d gestation. Patient reports no complaints. Fetal movement: normal.  Problem List Items Addressed This Visit      Other   Encounter for supervision of other normal pregnancy in first trimester - Primary     Clinic  [facility] Prenatal Labs  Dating  LMP Blood type: A/POS/-- (01/20 1354)   Genetic Screen 1 Screen:    AFP:     Quad:     NIPS: Antibody:NEG (01/20 1354)  Anatomic US  Rubella: 4.42 (01/20 1354)  GTT Early:               Third trimester:  RPR: Non Reactive (05/11 1050)   Flu vaccine  HBsAg: NEGATIVE (01/20 1354)   TDaP vaccine                                               Rhogam: N/A HIV: Non Reactive (05/11 1050)   Baby Food           Breast                                   WJX:BJYNWGNFGBS:Negative (06/28 1505)(For PCN allergy, check sensitivities)  Contraception  POP Pap:  Circumcision  [facility]   Pediatrician  Dr. Donnie Coffinubin   Support Person             Patient Active Problem List   Diagnosis Date Noted  . Encounter for supervision of other normal pregnancy in first trimester 12/23/2015  . Heart murmur     Objective:    BP 129/72 mmHg  Pulse 94  Temp(Src) 97.9 F (36.6 C)  Wt 162 lb 11.2 oz (73.8 kg)  LMP 09/30/2015 FHT: 145 BPM  Uterine Size: size equals dates  Presentations: cephalic  Pelvic Exam: deferred     Assessment:    Pregnancy @ 6252w0d weeks   Doing well  Plan:   Plans for delivery: Vaginal anticipated; labs reviewed; problem list updated Counseling: Consent signed. Infant feeding: plans to breastfeed. Cigarette smoking: never smoked. L&D discussion: symptoms of labor, discussed when to call, discussed what number to call, anesthetic/analgesic options reviewed and delivering clinician:  plans no preference. Postpartum supports and preparation: circumcision discussed and contraception plans discussed.  Follow up in 1 Week.

## 2016-06-15 NOTE — Addendum Note (Signed)
Addended by: Jimmy FootmanEVANS, Jeanpaul Biehl K on: 06/15/2016 12:05 PM   Modules accepted: Orders

## 2016-06-15 NOTE — Assessment & Plan Note (Signed)
  Clinic  [facility] Prenatal Labs  Dating  LMP Blood type: A/POS/-- (01/20 1354)   Genetic Screen 1 Screen:    AFP:     Quad:     NIPS: Antibody:NEG (01/20 1354)  Anatomic US  Rubella: 4.42 (01/20 1354)  GTT Early:               Third trimester:  RPR: Non Reactive (05/11 1050)   Flu vaccine  HBsAg: NEGATIVE (01/20 1354)   TDaP vaccine                                               Rhogam: N/A HIV: Non Reactive (05/11 1050)   Baby Food           Breast                                   ZOX:[id number]:Negative (06/28 1505)(For PCN allergy, check sensitivities)  Contraception  POP Pap:  Circumcision  [facility]   Pediatrician  Dr. Donnie Coffinubin   Support Person

## 2016-06-22 ENCOUNTER — Ambulatory Visit (INDEPENDENT_AMBULATORY_CARE_PROVIDER_SITE_OTHER): Payer: Medicaid Other | Admitting: Certified Nurse Midwife

## 2016-06-22 VITALS — BP 117/86 | HR 105 | Temp 98.7°F | Wt 161.1 lb

## 2016-06-22 DIAGNOSIS — Z3483 Encounter for supervision of other normal pregnancy, third trimester: Secondary | ICD-10-CM | POA: Diagnosis not present

## 2016-06-22 NOTE — Patient Instructions (Signed)
Before [facility] Before your baby arrives it is important to:  Have all of the supplies that you will need to care for your baby.  Know where to go if there is an emergency.  Discuss the baby's arrival with other family members. WHAT SUPPLIES WILL I NEED? It is recommended that you have the following supplies: Large Items  Crib.  Crib mattress.  Rear-facing infant car seat. If possible, have a trained professional check to make sure that it is installed correctly. Feeding  6-8 bottles that are 4-5 oz in size.  6-8 nipples.  Bottle brush.  Sterilizer, or a large pan or kettle with a lid.  A way to boil and cool water.  If you will be breastfeeding:  Breast pump.  Nipple cream.  Nursing bra.  Breast pads.  Breast shields.  If you will be formula feeding:  Formula.  Measuring cups.  Measuring spoons. Bathing  Mild baby soap and baby shampoo.  Petroleum jelly.  Soft cloth towel and washcloth.  Hooded towel.  Cotton balls.  Bath basin. Other Supplies  Rectal thermometer.  Bulb syringe.  Baby wipes or washcloths for diaper changes.  Diaper bag.  Changing pad.  Clothing, including one-piece outfits and pajamas.  Baby nail clippers.  Receiving blankets.  Mattress pad and sheets for the crib.  Night-light for the baby's room.  Baby monitor.  2 or 3 pacifiers.  Either 24-36 cloth diapers and waterproof diaper covers or a box of disposable diapers. You may need to use as many as 10-12 diapers per day. HOW DO I PREPARE FOR AN EMERGENCY? Prepare for an emergency by:  Knowing how to get to the nearest hospital.  Listing the phone numbers of your baby's health care providers near your home phone and in your cell phone. HOW DO I PREPARE MY FAMILY?  Decide how to handle visitors.  If you have other children:  Talk with them about the baby coming home. Ask them how they feel about it.  Read a book together about being a new big  brother or sister.  Find ways to let them help you prepare for the new baby.  Have someone ready to care for them while you are in the hospital.   This information is not intended to replace advice given to you by your health care provider. Make sure you discuss any questions you have with your health care provider.   Document Released: 11/01/2008 Document Revised: 04/05/2015 Document Reviewed: 10/27/2014 Elsevier Interactive Patient Education [employer] Third Trimester of Pregnancy The third trimester is from week 29 through week 42, months 7 through 9. The third trimester is a time when the fetus is growing rapidly. At the end of the ninth month, the fetus is about 20 inches in length and weighs 6-10 pounds.  BODY CHANGES Your body goes through many changes during pregnancy. The changes vary from woman to woman.   Your weight will continue to increase. You can expect to gain 25-35 pounds (11-16 kg) by the end of the pregnancy.  You may begin to get stretch marks on your hips, abdomen, and breasts.  You may urinate more often because the fetus is moving lower into your pelvis and pressing on your bladder.  You may develop or continue to have heartburn as a result of your pregnancy.  You may develop constipation because certain hormones are causing the muscles that push waste through your intestines to slow down.  You may develop hemorrhoids or  swollen, bulging veins (varicose veins).  You may have pelvic pain because of the weight gain and pregnancy hormones relaxing your joints between the bones in your pelvis. Backaches may result from overexertion of the muscles supporting your posture.  You may have changes in your hair. These can include thickening of your hair, rapid growth, and changes in texture. Some women also have hair loss during or after pregnancy, or hair that feels dry or thin. Your hair will most likely return to normal after your baby is born.  Your breasts  will continue to grow and be tender. A yellow discharge may leak from your breasts called colostrum.  Your belly button may stick out.  You may feel short of breath because of your expanding uterus.  You may notice the fetus "dropping," or moving lower in your abdomen.  You may have a bloody mucus discharge. This usually occurs a few days to a week before labor begins.  Your cervix becomes thin and soft (effaced) near your due date. WHAT TO EXPECT AT YOUR PRENATAL EXAMS  You will have prenatal exams every 2 weeks until week 36. Then, you will have weekly prenatal exams. During a routine prenatal visit:  You will be weighed to make sure you and the fetus are growing normally.  Your blood pressure is taken.  Your abdomen will be measured to track your baby's growth.  The fetal heartbeat will be listened to.  Any test results from the previous visit will be discussed.  You may have a cervical check near your due date to see if you have effaced. At around 36 weeks, your caregiver will check your cervix. At the same time, your caregiver will also perform a test on the secretions of the vaginal tissue. This test is to determine if a type of bacteria, Group B streptococcus, is present. Your caregiver will explain this further. Your caregiver may ask you:  What your birth plan is.  How you are feeling.  If you are feeling the baby move.  If you have had any abnormal symptoms, such as leaking fluid, bleeding, severe headaches, or abdominal cramping.  If you are using any tobacco products, including cigarettes, chewing tobacco, and electronic cigarettes.  If you have any questions. Other tests or screenings that may be performed during your third trimester include:  Blood tests that check for low iron levels (anemia).  Fetal testing to check the health, activity level, and growth of the fetus. Testing is done if you have certain medical conditions or if there are problems during the  pregnancy.  HIV (human immunodeficiency virus) testing. If you are at high risk, you may be screened for HIV during your third trimester of pregnancy. FALSE LABOR You may feel small, irregular contractions that eventually go away. These are called Braxton Hicks contractions, or false labor. Contractions may last for hours, days, or even weeks before true labor sets in. If contractions come at regular intervals, intensify, or become painful, it is best to be seen by your caregiver.  SIGNS OF LABOR   Menstrual-like cramps.  Contractions that are 5 minutes apart or less.  Contractions that start on the top of the uterus and spread down to the lower abdomen and back.  A sense of increased pelvic pressure or back pain.  A watery or bloody mucus discharge that comes from the vagina. If you have any of these signs before the 37th week of pregnancy, call your caregiver right away. You need to go to  the hospital to get checked immediately. HOME CARE INSTRUCTIONS   Avoid all smoking, herbs, alcohol, and unprescribed drugs. These chemicals affect the formation and growth of the baby.  Do not use any tobacco products, including cigarettes, chewing tobacco, and electronic cigarettes. If you need help quitting, ask your health care provider. You may receive counseling support and other resources to help you quit.  Follow your caregiver's instructions regarding medicine use. There are medicines that are either safe or unsafe to take during pregnancy.  Exercise only as directed by your caregiver. Experiencing uterine cramps is a good sign to stop exercising.  Continue to eat regular, healthy meals.  Wear a good support bra for breast tenderness.  Do not use hot tubs, steam rooms, or saunas.  Wear your seat belt at all times when driving.  Avoid raw meat, uncooked cheese, cat litter boxes, and soil used by cats. These carry germs that can cause birth defects in the baby.  Take your prenatal  vitamins.  Take 1500-2000 mg of calcium daily starting at the 20th week of pregnancy until you deliver your baby.  Try taking a stool softener (if your caregiver approves) if you develop constipation. Eat more high-fiber foods, such as fresh vegetables or fruit and whole grains. Drink plenty of fluids to keep your urine clear or pale yellow.  Take warm sitz baths to soothe any pain or discomfort caused by hemorrhoids. Use hemorrhoid cream if your caregiver approves.  If you develop varicose veins, wear support hose. Elevate your feet for 15 minutes, 3-4 times a day. Limit salt in your diet.  Avoid heavy lifting, wear low heal shoes, and practice good posture.  Rest a lot with your legs elevated if you have leg cramps or low back pain.  Visit your dentist if you have not gone during your pregnancy. Use a soft toothbrush to brush your teeth and be gentle when you floss.  A sexual relationship may be continued unless your caregiver directs you otherwise.  Do not travel far distances unless it is absolutely necessary and only with the approval of your caregiver.  Take prenatal classes to understand, practice, and ask questions about the labor and delivery.  Make a trial run to the hospital.  Pack your hospital bag.  Prepare the baby's nursery.  Continue to go to all your prenatal visits as directed by your caregiver. SEEK MEDICAL CARE IF:  You are unsure if you are in labor or if your water has broken.  You have dizziness.  You have mild pelvic cramps, pelvic pressure, or nagging pain in your abdominal area.  You have persistent nausea, vomiting, or diarrhea.  You have a bad smelling vaginal discharge.  You have pain with urination. SEEK IMMEDIATE MEDICAL CARE IF:   You have a fever.  You are leaking fluid from your vagina.  You have spotting or bleeding from your vagina.  You have severe abdominal cramping or pain.  You have rapid weight loss or gain.  You have  shortness of breath with chest pain.  You notice sudden or extreme swelling of your face, hands, ankles, feet, or legs.  You have not felt your baby move in over an hour.  You have severe headaches that do not go away with medicine.  You have vision changes.   This information is not intended to replace advice given to you by your health care provider. Make sure you discuss any questions you have with your health care provider.   Document  Released: 11/13/2001 Document Revised: 12/10/2014 Document Reviewed: 01/20/2013 Elsevier Interactive Patient Education [employer]

## 2016-06-22 NOTE — Progress Notes (Signed)
Subjective:    Debbie Wood is a 25 y.o. female being seen today for her obstetrical visit. She is at 4264w0d gestation. Patient reports no complaints. Fetal movement: normal.  Problem List Items Addressed This Visit    None    Visit Diagnoses    Encounter for supervision of other normal pregnancy in third trimester    -  Primary    Relevant Orders    NuSwab VG+, Candida 6sp      Patient Active Problem List   Diagnosis Date Noted  . Encounter for supervision of other normal pregnancy in first trimester 12/23/2015  . Heart murmur     Objective:    BP 117/86 mmHg  Pulse 105  Temp(Src) 98.7 F (37.1 C)  Wt 161 lb 1.6 oz (73.074 kg)  LMP 09/30/2015 FHT: 145 BPM  Uterine Size: size equals dates  Presentations: cephalic  Pelvic Exam:              Dilation: 1cm       Effacement: Long             Station:  Floating    Consistency: medium            Position: posterior     Assessment:    Pregnancy @ 4664w0d weeks   Plan:   Plans for delivery: Vaginal anticipated; labs reviewed; problem list updated Counseling: Consent signed. Infant feeding: plans to breastfeed. Cigarette smoking: never smoked. L&D discussion: symptoms of labor, discussed when to call, discussed what number to call, anesthetic/analgesic options reviewed and delivering clinician:  plans no preference. Postpartum supports and preparation: circumcision discussed and contraception plans discussed.  Follow up in 1 Week.

## 2016-06-26 ENCOUNTER — Other Ambulatory Visit: Payer: Self-pay | Admitting: Certified Nurse Midwife

## 2016-06-26 DIAGNOSIS — B3731 Acute candidiasis of vulva and vagina: Secondary | ICD-10-CM

## 2016-06-26 DIAGNOSIS — B373 Candidiasis of vulva and vagina: Secondary | ICD-10-CM

## 2016-06-26 LAB — NUSWAB VG+, CANDIDA 6SP
CANDIDA PARAPSILOSIS, NAA: NEGATIVE
CANDIDA TROPICALIS, NAA: NEGATIVE
Candida albicans, NAA: POSITIVE — AB
Candida glabrata, NAA: NEGATIVE
Candida krusei, NAA: NEGATIVE
Candida lusitaniae, NAA: NEGATIVE
Chlamydia trachomatis, NAA: NEGATIVE
Neisseria gonorrhoeae, NAA: NEGATIVE
TRICH VAG BY NAA: NEGATIVE

## 2016-06-26 LAB — OB RESULTS CONSOLE GC/CHLAMYDIA
Chlamydia: NEGATIVE
GC PROBE AMP, GENITAL: NEGATIVE

## 2016-06-26 MED ORDER — FLUCONAZOLE 100 MG PO TABS: 100.0000 mg | ORAL_TABLET | Freq: Once | ORAL | 0 refills | Status: AC

## 2016-06-30 ENCOUNTER — Inpatient Hospital Stay (HOSPITAL_COMMUNITY)
Admission: AD | Admit: 2016-06-30 | Discharge: 2016-07-03 | DRG: 775 | Disposition: A | Discharge status: Home / Self Care | Payer: Medicaid Other | Source: Ambulatory Visit | Attending: Family Medicine | Admitting: Family Medicine

## 2016-06-30 ENCOUNTER — Encounter (HOSPITAL_COMMUNITY): Payer: Self-pay | Admitting: *Deleted

## 2016-06-30 DIAGNOSIS — Z8249 Family history of ischemic heart disease and other diseases of the circulatory system: Secondary | ICD-10-CM

## 2016-06-30 DIAGNOSIS — O9902 Anemia complicating childbirth: Secondary | ICD-10-CM | POA: Diagnosis present

## 2016-06-30 DIAGNOSIS — Z3A39 39 weeks gestation of pregnancy: Secondary | ICD-10-CM

## 2016-06-30 DIAGNOSIS — Z87891 Personal history of nicotine dependence: Secondary | ICD-10-CM

## 2016-06-30 DIAGNOSIS — O133 Gestational [pregnancy-induced] hypertension without significant proteinuria, third trimester: Secondary | ICD-10-CM | POA: Diagnosis present

## 2016-06-30 DIAGNOSIS — O134 Gestational [pregnancy-induced] hypertension without significant proteinuria, complicating childbirth: Principal | ICD-10-CM | POA: Diagnosis present

## 2016-06-30 DIAGNOSIS — Z79899 Other long term (current) drug therapy: Secondary | ICD-10-CM

## 2016-06-30 LAB — PROTEIN / CREATININE RATIO, URINE
CREATININE, URINE: 111 mg/dL
Protein Creatinine Ratio: 0.27 mg/mg{Cre} — ABNORMAL HIGH (ref 0.00–0.15)
Total Protein, Urine: 30 mg/dL

## 2016-06-30 LAB — URINALYSIS, ROUTINE W REFLEX MICROSCOPIC
Bilirubin Urine: NEGATIVE
GLUCOSE, UA: NEGATIVE mg/dL
Hgb urine dipstick: NEGATIVE
Ketones, ur: NEGATIVE mg/dL
LEUKOCYTES UA: NEGATIVE
Nitrite: NEGATIVE
PH: 6 (ref 5.0–8.0)
Protein, ur: NEGATIVE mg/dL
SPECIFIC GRAVITY, URINE: 1.02 (ref 1.005–1.030)

## 2016-06-30 MED ORDER — HYDRALAZINE HCL 20 MG/ML IJ SOLN: 10.0000 mg | Freq: Once | INTRAMUSCULAR | Status: DC | PRN

## 2016-06-30 MED ORDER — ACETAMINOPHEN 325 MG PO TABS
650.0000 mg | ORAL_TABLET | Freq: Once | ORAL | Status: AC
  Administered 2016-06-30: 650 mg via ORAL
  Filled 2016-06-30: qty 2

## 2016-06-30 MED ORDER — LABETALOL HCL 5 MG/ML IV SOLN: 20.0000 mg | INTRAVENOUS | Status: DC | PRN

## 2016-06-30 NOTE — MAU Note (Addendum)
Contractions since 1700. Went walking about 1900 and ctxs stronger since then. Denies LOF or bleeding. Some increase clear d/c. Slight headache

## 2016-07-01 ENCOUNTER — Encounter (HOSPITAL_COMMUNITY): Payer: Self-pay

## 2016-07-01 ENCOUNTER — Inpatient Hospital Stay (HOSPITAL_COMMUNITY): Payer: Medicaid Other | Admitting: Anesthesiology

## 2016-07-01 DIAGNOSIS — Z3A39 39 weeks gestation of pregnancy: Secondary | ICD-10-CM | POA: Diagnosis present

## 2016-07-01 DIAGNOSIS — O9902 Anemia complicating childbirth: Secondary | ICD-10-CM | POA: Diagnosis present

## 2016-07-01 DIAGNOSIS — Z87891 Personal history of nicotine dependence: Secondary | ICD-10-CM | POA: Diagnosis not present

## 2016-07-01 DIAGNOSIS — Z79899 Other long term (current) drug therapy: Secondary | ICD-10-CM | POA: Diagnosis not present

## 2016-07-01 DIAGNOSIS — Z8249 Family history of ischemic heart disease and other diseases of the circulatory system: Secondary | ICD-10-CM | POA: Diagnosis not present

## 2016-07-01 DIAGNOSIS — O133 Gestational [pregnancy-induced] hypertension without significant proteinuria, third trimester: Secondary | ICD-10-CM | POA: Diagnosis present

## 2016-07-01 DIAGNOSIS — I10 Essential (primary) hypertension: Secondary | ICD-10-CM

## 2016-07-01 DIAGNOSIS — O134 Gestational [pregnancy-induced] hypertension without significant proteinuria, complicating childbirth: Secondary | ICD-10-CM | POA: Diagnosis present

## 2016-07-01 HISTORY — DX: Gestational (pregnancy-induced) hypertension without significant proteinuria, third trimester: O13.3

## 2016-07-01 LAB — COMPREHENSIVE METABOLIC PANEL
ALBUMIN: 2.9 g/dL — AB (ref 3.5–5.0)
ALT: 12 U/L — ABNORMAL LOW (ref 14–54)
AST: 25 U/L (ref 15–41)
Alkaline Phosphatase: 163 U/L — ABNORMAL HIGH (ref 38–126)
Anion gap: 8 (ref 5–15)
BUN: 5 mg/dL — AB (ref 6–20)
CHLORIDE: 103 mmol/L (ref 101–111)
CO2: 23 mmol/L (ref 22–32)
Calcium: 9.1 mg/dL (ref 8.9–10.3)
Creatinine, Ser: 0.63 mg/dL (ref 0.44–1.00)
GFR calc Af Amer: >60 mL/min (ref >60)
GFR calc non Af Amer: >60 mL/min (ref >60)
GLUCOSE: 92 mg/dL (ref 65–99)
POTASSIUM: 3.5 mmol/L (ref 3.5–5.1)
SODIUM: 134 mmol/L — AB (ref 135–145)
Total Bilirubin: 0.6 mg/dL (ref 0.3–1.2)
Total Protein: 6.7 g/dL (ref 6.5–8.1)

## 2016-07-01 LAB — CBC
HEMATOCRIT: 26.1 % — AB (ref 36.0–46.0)
HEMATOCRIT: 26.8 % — AB (ref 36.0–46.0)
HEMOGLOBIN: 8.7 g/dL — AB (ref 12.0–15.0)
Hemoglobin: 8.7 g/dL — ABNORMAL LOW (ref 12.0–15.0)
MCH: 26.5 pg (ref 26.0–34.0)
MCH: 25.2 pg — AB (ref 26.0–34.0)
MCHC: 33.3 g/dL (ref 30.0–36.0)
MCHC: 32.5 g/dL (ref 30.0–36.0)
MCV: 79.6 fL (ref 78.0–100.0)
MCV: 77.7 fL — AB (ref 78.0–100.0)
PLATELETS: 241 10*3/uL (ref 150–400)
Platelets: 279 10*3/uL (ref 150–400)
RBC: 3.28 MIL/uL — ABNORMAL LOW (ref 3.87–5.11)
RBC: 3.45 MIL/uL — ABNORMAL LOW (ref 3.87–5.11)
RDW: 15.9 % — ABNORMAL HIGH (ref 11.5–15.5)
RDW: 15.8 % — AB (ref 11.5–15.5)
WBC: 12.7 10*3/uL — ABNORMAL HIGH (ref 4.0–10.5)
WBC: 12.8 10*3/uL — ABNORMAL HIGH (ref 4.0–10.5)

## 2016-07-01 LAB — TYPE AND SCREEN
ABO/RH(D): A POS
Antibody Screen: NEGATIVE

## 2016-07-01 LAB — ABO/RH: ABO/RH(D): A POS

## 2016-07-01 LAB — HIV ANTIBODY (ROUTINE TESTING W REFLEX): HIV Screen 4th Generation wRfx: NONREACTIVE

## 2016-07-01 LAB — RPR: RPR: NONREACTIVE

## 2016-07-01 MED ORDER — PROMETHAZINE HCL 25 MG/ML IJ SOLN
12.5000 mg | INTRAMUSCULAR | Status: DC | PRN
  Administered 2016-07-01: 12.5 mg via INTRAVENOUS
  Filled 2016-07-01: qty 1

## 2016-07-01 MED ORDER — SODIUM CHLORIDE 0.9% FLUSH: 3.0000 mL | Freq: Two times a day (BID) | INTRAVENOUS | Status: DC

## 2016-07-01 MED ORDER — BENZOCAINE-MENTHOL 20-0.5 % EX AERO: 1.0000 "application " | INHALATION_SPRAY | CUTANEOUS | Status: DC | PRN

## 2016-07-01 MED ORDER — PHENYLEPHRINE 40 MCG/ML (10ML) SYRINGE FOR IV PUSH (FOR BLOOD PRESSURE SUPPORT)
80.0000 ug | PREFILLED_SYRINGE | INTRAVENOUS | Status: DC | PRN
  Filled 2016-07-01: qty 5

## 2016-07-01 MED ORDER — MEASLES, MUMPS & RUBELLA VAC ~~LOC~~ INJ
0.5000 mL | INJECTION | Freq: Once | SUBCUTANEOUS | Status: DC
  Filled 2016-07-01: qty 0.5

## 2016-07-01 MED ORDER — FENTANYL 2.5 MCG/ML BUPIVACAINE 1/10 % EPIDURAL INFUSION (WH - ANES)
14.0000 mL/h | INTRAMUSCULAR | Status: DC | PRN
  Administered 2016-07-01 (×2): 14 mL/h via EPIDURAL
  Filled 2016-07-01: qty 125

## 2016-07-01 MED ORDER — LACTATED RINGERS IV SOLN: 500.0000 mL | Freq: Once | INTRAVENOUS | Status: DC

## 2016-07-01 MED ORDER — SIMETHICONE 80 MG PO CHEW: 80.0000 mg | CHEWABLE_TABLET | ORAL | Status: DC | PRN

## 2016-07-01 MED ORDER — DIPHENHYDRAMINE HCL 25 MG PO CAPS: 25.0000 mg | ORAL_CAPSULE | Freq: Four times a day (QID) | ORAL | Status: DC | PRN

## 2016-07-01 MED ORDER — SODIUM CHLORIDE 0.9 % IV SOLN: 250.0000 mL | INTRAVENOUS | Status: DC | PRN

## 2016-07-01 MED ORDER — PRENATAL MULTIVITAMIN CH
1.0000 | ORAL_TABLET | Freq: Every day | ORAL | Status: DC
  Filled 2016-07-01: qty 1

## 2016-07-01 MED ORDER — EPHEDRINE 5 MG/ML INJ
10.0000 mg | INTRAVENOUS | Status: DC | PRN
  Filled 2016-07-01: qty 4

## 2016-07-01 MED ORDER — DIPHENHYDRAMINE HCL 50 MG/ML IJ SOLN: 12.5000 mg | INTRAMUSCULAR | Status: DC | PRN

## 2016-07-01 MED ORDER — OXYTOCIN 40 UNITS IN LACTATED RINGERS INFUSION - SIMPLE MED
1.0000 m[IU]/min | INTRAVENOUS | Status: DC
  Administered 2016-07-01: 2 m[IU]/min via INTRAVENOUS
  Filled 2016-07-01: qty 1000

## 2016-07-01 MED ORDER — IBUPROFEN 600 MG PO TABS
600.0000 mg | ORAL_TABLET | Freq: Four times a day (QID) | ORAL | Status: DC
  Administered 2016-07-02 (×2): 600 mg via ORAL
  Filled 2016-07-01 (×2): qty 1

## 2016-07-01 MED ORDER — SOD CITRATE-CITRIC ACID 500-334 MG/5ML PO SOLN: 30.0000 mL | ORAL | Status: DC | PRN

## 2016-07-01 MED ORDER — FLEET ENEMA 7-19 GM/118ML RE ENEM: 1.0000 | ENEMA | RECTAL | Status: DC | PRN

## 2016-07-01 MED ORDER — LACTATED RINGERS IV SOLN: 500.0000 mL | INTRAVENOUS | Status: DC | PRN

## 2016-07-01 MED ORDER — PHENYLEPHRINE 40 MCG/ML (10ML) SYRINGE FOR IV PUSH (FOR BLOOD PRESSURE SUPPORT)
80.0000 ug | PREFILLED_SYRINGE | INTRAVENOUS | Status: DC | PRN
  Filled 2016-07-01: qty 5
  Filled 2016-07-01: qty 10

## 2016-07-01 MED ORDER — ONDANSETRON HCL 4 MG PO TABS: 4.0000 mg | ORAL_TABLET | ORAL | Status: DC | PRN

## 2016-07-01 MED ORDER — OXYCODONE-ACETAMINOPHEN 5-325 MG PO TABS: 1.0000 | ORAL_TABLET | ORAL | Status: DC | PRN

## 2016-07-01 MED ORDER — LIDOCAINE HCL (PF) 1 % IJ SOLN
30.0000 mL | INTRAMUSCULAR | Status: DC | PRN
  Filled 2016-07-01: qty 30

## 2016-07-01 MED ORDER — OXYTOCIN 40 UNITS IN LACTATED RINGERS INFUSION - SIMPLE MED: 2.5000 [IU]/h | INTRAVENOUS | Status: DC

## 2016-07-01 MED ORDER — NALBUPHINE HCL 10 MG/ML IJ SOLN
10.0000 mg | INTRAMUSCULAR | Status: DC | PRN
  Administered 2016-07-01: 10 mg via INTRAVENOUS
  Filled 2016-07-01: qty 1

## 2016-07-01 MED ORDER — ACETAMINOPHEN 325 MG PO TABS: 650.0000 mg | ORAL_TABLET | ORAL | Status: DC | PRN

## 2016-07-01 MED ORDER — WITCH HAZEL-GLYCERIN EX PADS: 1.0000 "application " | MEDICATED_PAD | CUTANEOUS | Status: DC | PRN

## 2016-07-01 MED ORDER — ZOLPIDEM TARTRATE 5 MG PO TABS: 5.0000 mg | ORAL_TABLET | Freq: Every evening | ORAL | Status: DC | PRN

## 2016-07-01 MED ORDER — ONDANSETRON HCL 4 MG/2ML IJ SOLN: 4.0000 mg | Freq: Four times a day (QID) | INTRAMUSCULAR | Status: DC | PRN

## 2016-07-01 MED ORDER — DIBUCAINE 1 % RE OINT: 1.0000 "application " | TOPICAL_OINTMENT | RECTAL | Status: DC | PRN

## 2016-07-01 MED ORDER — LACTATED RINGERS IV SOLN
INTRAVENOUS | Status: DC
  Administered 2016-07-01 (×2): via INTRAVENOUS

## 2016-07-01 MED ORDER — HYDRALAZINE HCL 20 MG/ML IJ SOLN: 10.0000 mg | Freq: Once | INTRAMUSCULAR | Status: DC | PRN

## 2016-07-01 MED ORDER — COCONUT OIL OIL: 1.0000 "application " | TOPICAL_OIL | Status: DC | PRN

## 2016-07-01 MED ORDER — TETANUS-DIPHTH-ACELL PERTUSSIS 5-2.5-18.5 LF-MCG/0.5 IM SUSP: 0.5000 mL | Freq: Once | INTRAMUSCULAR | Status: DC

## 2016-07-01 MED ORDER — ONDANSETRON HCL 4 MG/2ML IJ SOLN: 4.0000 mg | INTRAMUSCULAR | Status: DC | PRN

## 2016-07-01 MED ORDER — OXYTOCIN BOLUS FROM INFUSION
500.0000 mL | Freq: Once | INTRAVENOUS | Status: AC
  Administered 2016-07-01: 500 mL via INTRAVENOUS

## 2016-07-01 MED ORDER — SODIUM CHLORIDE 0.9% FLUSH: 3.0000 mL | INTRAVENOUS | Status: DC | PRN

## 2016-07-01 MED ORDER — SENNOSIDES-DOCUSATE SODIUM 8.6-50 MG PO TABS
2.0000 | ORAL_TABLET | ORAL | Status: DC
  Administered 2016-07-02 (×2): 2 via ORAL
  Filled 2016-07-01 (×2): qty 2

## 2016-07-01 MED ORDER — TERBUTALINE SULFATE 1 MG/ML IJ SOLN
0.2500 mg | Freq: Once | INTRAMUSCULAR | Status: DC | PRN
  Filled 2016-07-01: qty 1

## 2016-07-01 MED ORDER — ACETAMINOPHEN 325 MG PO TABS
650.0000 mg | ORAL_TABLET | ORAL | Status: DC | PRN
  Administered 2016-07-02: 650 mg via ORAL
  Filled 2016-07-01: qty 2

## 2016-07-01 MED ORDER — LIDOCAINE HCL (PF) 1 % IJ SOLN
INTRAMUSCULAR | Status: AC | PRN
  Administered 2016-07-01 (×2): 4 mL
  Administered 2019-01-30: 11 mL via EPIDURAL

## 2016-07-01 MED ORDER — MISOPROSTOL 25 MCG QUARTER TABLET
25.0000 ug | ORAL_TABLET | ORAL | Status: DC | PRN
  Administered 2016-07-01: 25 ug via VAGINAL
  Filled 2016-07-01: qty 0.25
  Filled 2016-07-01: qty 1

## 2016-07-01 MED ORDER — OXYCODONE-ACETAMINOPHEN 5-325 MG PO TABS: 2.0000 | ORAL_TABLET | ORAL | Status: DC | PRN

## 2016-07-01 MED ORDER — LABETALOL HCL 5 MG/ML IV SOLN: 20.0000 mg | INTRAVENOUS | Status: DC | PRN

## 2016-07-01 NOTE — Anesthesia Procedure Notes (Signed)
Epidural Patient location during procedure: OB  Staffing Anesthesiologist: Cynthis Purington Performed: anesthesiologist   Preanesthetic Checklist Completed: patient identified, site marked, surgical consent, pre-op evaluation, timeout performed, IV checked, risks and benefits discussed and monitors and equipment checked  Epidural Patient position: sitting Prep: site prepped and draped and DuraPrep Patient monitoring: continuous pulse ox and blood pressure Approach: midline Location: L3-L4 Injection technique: LOR saline  Needle:  Needle type: Tuohy  Needle gauge: 17 G Needle length: 9 cm and 9 Needle insertion depth: 5 cm cm Catheter type: closed end flexible Catheter size: 19 Gauge Catheter at skin depth: 10 cm Test dose: negative  Assessment Events: blood not aspirated, injection not painful, no injection resistance, negative IV test and no paresthesia  Additional Notes Patient identified. Risks/Benefits/Options discussed with patient including but not limited to bleeding, infection, nerve damage, paralysis, failed block, incomplete pain control, headache, blood pressure changes, nausea, vomiting, reactions to medication both or allergic, itching and postpartum back pain. Confirmed with bedside nurse the patient's most recent platelet count. Confirmed with patient that they are not currently taking any anticoagulation, have any bleeding history or any family history of bleeding disorders. Patient expressed understanding and wished to proceed. All questions were answered. Sterile technique was used throughout the entire procedure. Please see nursing notes for vital signs. Test dose was given through epidural catheter and negative prior to continuing to dose epidural or start infusion. Warning signs of high block given to the patient including shortness of breath, tingling/numbness in hands, complete motor block, or any concerning symptoms with instructions to call for help. Patient was  given instructions on fall risk and not to get out of bed. All questions and concerns addressed with instructions to call with any issues or inadequate analgesia.        

## 2016-07-01 NOTE — Progress Notes (Signed)
Dave A Marolf is a 25 y.o. G3P2001 at [redacted]w[redacted]d by ultrasound admitted for induction of labor due to Hypertension.  Subjective:   Objective: BP (!) 131/94   Pulse 82   Temp 98.1 F (36.7 C) (Oral)   Resp 18   Ht 5\' 3"  (1.6 m)   Wt 171 lb (77.6 kg)   LMP 09/30/2015   BMI 30.29 kg/m  No intake/output data recorded. No intake/output data recorded.  FHT:  FHR: 130 bpm, variability: moderate,  accelerations:  Present,  decelerations:  Present early UC:   regular, every 2-4 minutes SVE:   Dilation: 4 Effacement (%): 100 Station: -1, 0 Exam by:: Zerita Boers, CNM  Labs: Lab Results  Component Value Date   WBC 12.7 (H) 06/30/2016   HGB 8.7 (L) 06/30/2016   HCT 26.1 (L) 06/30/2016   MCV 79.6 06/30/2016   PLT 279 06/30/2016    Assessment / Plan: Induction of labor due to gestational hypertension,  progressing well on pitocin  Labor: Progressing normally Preeclampsia:  no signs or symptoms of toxicity and intake and ouput balanced Fetal Wellbeing:  Category I Pain Control:  IV pain meds I/D:  n/a Anticipated MOD:  NSVD  Wyvonnia Dusky 07/01/2016, 3:23 PM

## 2016-07-01 NOTE — Anesthesia Preprocedure Evaluation (Signed)
Anesthesia Evaluation  Patient identified by MRN, date of birth, ID band Patient awake    Reviewed: Allergy & Precautions, NPO status , Patient's Chart, lab work & pertinent test results  History of Anesthesia Complications Negative for: history of anesthetic complications  Airway Mallampati: II  TM Distance: >3 FB Neck ROM: Full    Dental no notable dental hx. (+) Dental Advisory Given   Pulmonary former smoker,    Pulmonary exam normal breath sounds clear to auscultation       Cardiovascular Exercise Tolerance: Good negative cardio ROS Normal cardiovascular examI+ Valvular Problems/Murmurs  Rhythm:Regular Rate:Normal  Echo 3/17: Echo showed normal LVF with mild TR with very mild pulmonary HTN - repeat echo in 1 year   Neuro/Psych negative neurological ROS  negative psych ROS   GI/Hepatic negative GI ROS, Neg liver ROS,   Endo/Other  obesity  Renal/GU negative Renal ROS  negative genitourinary   Musculoskeletal negative musculoskeletal ROS (+)   Abdominal   Peds negative pediatric ROS (+)  Hematology negative hematology ROS (+)   Anesthesia Other Findings   Reproductive/Obstetrics (+) Pregnancy                             Anesthesia Physical Anesthesia Plan  ASA: II  Anesthesia Plan: Epidural   Post-op Pain Management:    Induction:   Airway Management Planned:   Additional Equipment:   Intra-op Plan:   Post-operative Plan:   Informed Consent: I have reviewed the patients History and Physical, chart, labs and discussed the procedure including the risks, benefits and alternatives for the proposed anesthesia with the patient or authorized representative who has indicated his/her understanding and acceptance.   Dental advisory given  Plan Discussed with: CRNA  Anesthesia Plan Comments:         Anesthesia Quick Evaluation

## 2016-07-01 NOTE — Anesthesia Pain Management Evaluation Note (Signed)
  [facility] Pain Management Visit Note  Patient: Debbie Wood, 25 y.o., female  "Hello I am a member of the anesthesia team at [facility]. We have an anesthesia team available at all times to provide care throughout the hospital, including epidural management and anesthesia for C-section. I don't know your plan for the delivery whether it a natural birth, water birth, IV sedation, nitrous supplementation, doula or epidural, but we want to meet your pain goals."   1.Was your pain managed to your expectations on prior hospitalizations?     2.What is your expectation for pain management during this hospitalization?     3.How can we help you reach that goal?   Record the patient's initial score and the patient's pain goal.   Pain: 5  Pain Goal: 7 [facility] wants you to be able to say your pain was always managed very well.  Eddie Payette Hristova 07/01/2016

## 2016-07-01 NOTE — Progress Notes (Signed)
Debbie Wood is a 25 y.o. G3P2001 at [redacted]w[redacted]d by ultrasound admitted for induction of labor due to Hypertension.  Subjective:   Objective: BP 131/80 (BP Location: Left Arm)   Pulse 84   Temp 98.5 F (36.9 C) (Oral)   Resp 18   Ht 5\' 3"  (1.6 m)   Wt 171 lb (77.6 kg)   LMP 09/30/2015   BMI 30.29 kg/m  No intake/output data recorded. No intake/output data recorded.  FHT:  FHR: 140's bpm, variability: moderate,  accelerations:  Present,  decelerations:  Absent UC:   regular, every 2 minutes annd mild SVE:   Dilation: 1 Effacement (%): 70 Station: -1 Exam by:: Zerita Boers, CNM  Labs: Lab Results  Component Value Date   WBC 12.7 (H) 06/30/2016   HGB 8.7 (L) 06/30/2016   HCT 26.1 (L) 06/30/2016   MCV 79.6 06/30/2016   PLT 279 06/30/2016    Assessment / Plan: yet to be in labor  Labor: Progressing normally Preeclampsia:  no signs or symptoms of toxicity and intake and ouput balanced Fetal Wellbeing:  Category I Pain Control:  Labor support without medications I/D:  n/a Anticipated MOD:  NSVD  Debbie Wood 07/01/2016, 11:27 AM

## 2016-07-01 NOTE — Lactation Note (Signed)
This note was copied from a baby's chart. Lactation Consultation Note  Patient Name: Debbie Wood [id number] Date: 07/01/2016 Reason for consult: Initial assessment Baby at 3 hr of life. Mom reports baby is bf well. She denies breast or nipple pain. She wants to bf and formula feed in the hospital and when she gets home she thinks she mostly formula feed. She bf her middle child 2 wk "because that's what the baby wanted to do". Her oldest child died at 69 days of life from sepsis. Discussed baby behavior, feeding frequency, baby belly size, voids, wt loss, breast changes, and nipple care. She stated she can manually express but does not think she will have enough to "fill up the spoon". Discussed using the spoon to feed formula instead of offering a bottle. Given lactation handouts. Aware of OP services and support group.    Maternal Data Has patient been taught Hand Expression?: Yes Does the patient have breastfeeding experience prior to this delivery?: Yes  Feeding Feeding Type: Breast Fed Length of feed: 15 min  LATCH Score/Interventions Latch: Grasps breast easily, tongue down, lips flanged, rhythmical sucking.  Audible Swallowing: None Intervention(s): Skin to skin;Hand expression  Type of Nipple: Everted at rest and after stimulation  Comfort (Breast/Nipple): Soft / non-tender     Hold (Positioning): Assistance needed to correctly position infant at breast and maintain latch. Intervention(s): Breastfeeding basics reviewed;Support Pillows;Position options;Skin to skin  LATCH Score: 7  Lactation Tools Discussed/Used WIC Program: Yes   Consult Status Consult Status: Follow-up Date: 07/02/16 Follow-up type: In-patient    Debbie Wood 07/01/2016, 8:57 PM

## 2016-07-01 NOTE — Progress Notes (Signed)
Debbie Wood is a 25 y.o. G3P2001 at [redacted]w[redacted]d by ultrasound admitted for induction of labor due to Hypertension.  Subjective:   Objective: BP 138/70   Pulse 80   Temp 97.8 F (36.6 C) (Oral)   Resp 18   Ht 5\' 3"  (1.6 m)   Wt 171 lb (77.6 kg)   LMP 09/30/2015   BMI 30.29 kg/m  No intake/output data recorded. No intake/output data recorded.  FHT:  FHR: 140 bpm, variability: moderate,  accelerations:  Present,  decelerations:  Absent UC:   regular, every 2-5 minutes and mild SVE:   Dilation: 1 Effacement (%): 70 Station: -1 Exam by:: Zerita Boers, CNM  Labs: Lab Results  Component Value Date   WBC 12.7 (H) 06/30/2016   HGB 8.7 (L) 06/30/2016   HCT 26.1 (L) 06/30/2016   MCV 79.6 06/30/2016   PLT 279 06/30/2016    Assessment / Plan: Induction of labor due to gestational hypertension,  progressing well on pitocin  Labor: not yet in labor Preeclampsia:  no signs or symptoms of toxicity and intake and ouput balanced Fetal Wellbeing:  Category I Pain Control:  Labor support without medications I/D:  n/a Anticipated MOD:  NSVD  Wyvonnia Dusky 07/01/2016, 9:23 AM

## 2016-07-01 NOTE — H&P (Signed)
LABOR AND DELIVERY ADMISSION HISTORY AND PHYSICAL NOTE  Debbie Wood is a 25 y.o. female G3P2001 with IUP at [redacted]w[redacted]d by LMP presenting for gestational hypertension. Patient initially came to [facility] for contractions and thought she was in labor. Patient was found to not be in labor but had subsequent elevated blood pressures on multiple readings. No previous history of high blood pressure, especially during the pregnancy. Denies CP/SOB, headache, change in vision, epigastric/RUQ pain.   She reports positive fetal movement. She denies leakage of fluid or vaginal bleeding.  Prenatal History/Complications:  Past Medical History: Past Medical History:  Diagnosis Date  . Chest pain   . Heart murmur     Past Surgical History: Past Surgical History:  Procedure Laterality Date  . NO PAST SURGERIES      Obstetrical History: OB History    Gravida Para Term Preterm AB Living   0 0 1   SAB TAB Ectopic Multiple Live Births   0 0 0 0 2      Social History: Social History   Social History  . Marital status: Single    Spouse name: N/A  . Number of children: N/A  . Years of education: N/A   Social History Main Topics  . Smoking status: Former Smoker    Types: Cigarettes    Quit date: 06/03/2009  . Smokeless tobacco: Never Used  . Alcohol use No  . Drug use: No  . Sexual activity: Not Currently   Other Topics Concern  . None   Social History Narrative  . None    Family History: Family History  Problem Relation Age of Onset  . Hypertension Mother   . Hyperlipidemia Mother   . Hypertension Father   . Heart attack Neg Hx     Allergies: No Known Allergies  Prescriptions Prior to Admission  Medication Sig Dispense Refill Last Dose  . azithromycin (ZITHROMAX) 250 MG tablet Take 4 tablets all together now. (Patient not taking: Reported on 06/22/2016) 4 tablet 0 Not Taking  . Iron Polysacch Cmplx-B12-FA 150-0.025-1 MG CAPS Take 1 tablet by mouth daily. 30 each 5  Taking  . Prenatal Vit-Fe Phos-FA-Omega (VITAFOL GUMMIES) 3.33-0.333-34.8 MG CHEW Chew 3 tablets by mouth daily. 90 tablet 12 Taking     Review of Systems   All systems reviewed and negative except as stated in HPI  Blood pressure 143/85, pulse 78, temperature 97.5 F (36.4 C), resp. rate 18, last menstrual period 09/30/2015. General appearance: alert, cooperative, appears stated age and no distress Lungs: clear to auscultation bilaterally Heart: regular rate and rhythm Abdomen: soft, non-tender; bowel sounds normal Extremities: No calf swelling or tenderness Presentation: cephalic Fetal monitoring: 140, mod var, +accels, no decels Uterine activity: Irritable, irregular Dilation: 1 Effacement (%): 80 Station: -1 Exam by:: K.Wilson,RN   Prenatal labs: ABO, Rh: A/POS/-- (01/20 1354) Antibody: NEG (01/20 1354) Rubella: !Error! RPR: Non Reactive (05/11 1050)  HBsAg: NEGATIVE (01/20 1354)  HIV: Non Reactive (05/11 1050)  GBS: Negative (06/28 1505)  2 hr Glucola: 81/80/90 NEG Genetic screening:  NEG Anatomy US: WNL  Prenatal Transfer Tool  Maternal Diabetes: No Genetic Screening: Normal Maternal Ultrasounds/Referrals: Normal Fetal Ultrasounds or other Referrals:  None Maternal Substance Abuse:  No Significant Maternal Medications:  None Significant Maternal Lab Results: None  Results for orders placed or performed during the hospital encounter of 06/30/16 (from the past 24 hour(s))  Urinalysis, Routine w reflex microscopic (not at [facility])   Collection Time: 06/30/16 10:00 PM  Result Value Ref Range   Color, Urine YELLOW YELLOW   APPearance HAZY (A) CLEAR   Specific Gravity, Urine 1.020 1.005 - 1.030   pH 6.0 5.0 - 8.0   Glucose, UA NEGATIVE NEGATIVE mg/dL   Hgb urine dipstick NEGATIVE NEGATIVE   Bilirubin Urine NEGATIVE NEGATIVE   Ketones, ur NEGATIVE NEGATIVE mg/dL   Protein, ur NEGATIVE NEGATIVE mg/dL   Nitrite NEGATIVE NEGATIVE   Leukocytes, UA NEGATIVE NEGATIVE   Protein / creatinine ratio, urine   Collection Time: 06/30/16 10:00 PM  Result Value Ref Range   Creatinine, Urine 111.00 mg/dL   Total Protein, Urine 30 mg/dL   Protein Creatinine Ratio 0.27 (H) 0.00 - 0.15 mg/mg[Cre]  CBC   Collection Time: 06/30/16 11:27 PM  Result Value Ref Range   WBC 12.7 (H) 4.0 - 10.5 K/uL   RBC 3.28 (L) 3.87 - 5.11 MIL/uL   Hemoglobin 8.7 (L) 12.0 - 15.0 g/dL   HCT 19.3 (L) 79.0 - 24.0 %   MCV 79.6 78.0 - 100.0 fL   MCH 26.5 26.0 - 34.0 pg   MCHC 33.3 30.0 - 36.0 g/dL   RDW 97.3 (H) 53.2 - 99.2 %   Platelets 279 150 - 400 K/uL  Comprehensive metabolic panel   Collection Time: 06/30/16 11:30 PM  Result Value Ref Range   Sodium 134 (L) 135 - 145 mmol/L   Potassium 3.5 3.5 - 5.1 mmol/L   Chloride 103 101 - 111 mmol/L   CO2 23 22 - 32 mmol/L   Glucose, Bld 92 65 - 99 mg/dL   BUN 5 (L) 6 - 20 mg/dL   Creatinine, Ser 4.26 0.44 - 1.00 mg/dL   Calcium 9.1 8.9 - 83.4 mg/dL   Total Protein 6.7 6.5 - 8.1 g/dL   Albumin 2.9 (L) 3.5 - 5.0 g/dL   AST 25 15 - 41 U/L   ALT 12 (L) 14 - 54 U/L   Alkaline Phosphatase 163 (H) 38 - 126 U/L   Total Bilirubin 0.6 0.3 - 1.2 mg/dL   GFR calc non Af Amer >60 >60 mL/min   GFR calc Af Amer >60 >60 mL/min   Anion gap 8 5 - 15    Patient Active Problem List   Diagnosis Date Noted  . Encounter for supervision of other normal pregnancy in first trimester 12/23/2015  . Heart murmur     Assessment: Debbie Wood is a 25 y.o. G3P2001 at [redacted]w[redacted]d here for gestational hypertension, induction.  #Labor: Induction, start with cytotec #Pain:  Epidural as requested #FWB:  Cat I #ID:  GBS neg #MOF: breast #MOC: POP #Circ:  Yes  Jen Mow, DO 07/01/2016, 1:45 AM

## 2016-07-02 ENCOUNTER — Encounter: Payer: Medicaid Other | Admitting: Obstetrics

## 2016-07-02 MED ORDER — IRON POLYSACCH CMPLX-B12-FA 150-0.025-1 MG PO CAPS: 1.0000 | ORAL_CAPSULE | Freq: Two times a day (BID) | ORAL | Status: DC

## 2016-07-02 MED ORDER — OXYCODONE-ACETAMINOPHEN 5-325 MG PO TABS
1.0000 | ORAL_TABLET | ORAL | Status: DC | PRN
  Administered 2016-07-02 – 2016-07-03 (×2): 1 via ORAL
  Filled 2016-07-02 (×2): qty 1

## 2016-07-02 MED ORDER — POLYSACCHARIDE IRON COMPLEX 150 MG PO CAPS: 150.0000 mg | ORAL_CAPSULE | Freq: Two times a day (BID) | ORAL | Status: DC

## 2016-07-02 MED ORDER — POLYSACCHARIDE IRON COMPLEX 150 MG PO CAPS
150.0000 mg | ORAL_CAPSULE | Freq: Two times a day (BID) | ORAL | Status: DC
  Administered 2016-07-02: 150 mg via ORAL
  Filled 2016-07-02 (×2): qty 1

## 2016-07-02 NOTE — Anesthesia Postprocedure Evaluation (Signed)
Anesthesia Post Note  Patient: Debbie Wood  Procedure(s) Performed: * No procedures listed *  Patient location during evaluation: Mother Baby Anesthesia Type: Epidural Level of consciousness: awake and alert Pain management: pain level controlled Vital Signs Assessment: post-procedure vital signs reviewed and stable Respiratory status: spontaneous breathing and nonlabored ventilation Cardiovascular status: stable Postop Assessment: no headache, patient able to bend at knees, no backache, no signs of nausea or vomiting, epidural receding and adequate PO intake Anesthetic complications: no     Last Vitals:  Vitals:   07/01/16 2050 07/02/16 0040  BP: 136/89 139/69  Pulse: 96 88  Resp: 18 18  Temp: 36.8 C 36.8 C    Last Pain:  Vitals:   07/02/16 0720  TempSrc:   PainSc: 0-No pain   Pain Goal: Patients Stated Pain Goal: 0 (06/30/16 2200)               Donnalee Curry Hristova

## 2016-07-02 NOTE — Lactation Note (Signed)
This note was copied from a baby's chart. Lactation Consultation Note: Mother reports that infant is feeding well. Infant just had bath and mother and infant are skin to skin. Mother denies having any concerns. Discussed need to frequently feed with all feeding cues. Mother receptive to teach and will page to check latch.   Patient Name: Debbie Wood ZRAQT'M Date: 07/02/2016     Maternal Data    Feeding    LATCH Score/Interventions                      Lactation Tools Discussed/Used     Consult Status      Michel Bickers 07/02/2016, 10:13 AM

## 2016-07-02 NOTE — Progress Notes (Signed)
Patient ID: Debbie Wood, female   DOB: 08-11-1991, 25 y.o.   MRN: [id number]   Post Partum Day 1 SVD, IOL for GHTN  Subjective: No acute events overnight. Baby resting skin to skin with mom this morning. Mild crampy pain, controlled with ibuprofen. Minimal lochia. Hx of anemia but reports no dizziness or SOB. MOF: breast. MOC: POPs then switch to combined OCPs after breast milk comes in.   Objective: Blood pressure 139/69, pulse 88, temperature 98.3 F (36.8 C), temperature source Oral, resp. rate 18, height 5\' 3"  (1.6 m), weight 77.6 kg (171 lb), last menstrual period 09/30/2015, unknown if currently breastfeeding.  Physical Exam:  General: alert, cooperative and no distress Lochia: appropriate Uterine Fundus: firm DVT Evaluation: No evidence of DVT seen on physical exam. Negative Homan's sign.  Results for orders placed or performed during the hospital encounter of 06/30/16 (from the past 24 hour(s))  CBC     Status: Abnormal   Collection Time: 07/01/16  3:21 PM  Result Value Ref Range   WBC 12.8 (H) 4.0 - 10.5 K/uL   RBC 3.45 (L) 3.87 - 5.11 MIL/uL   Hemoglobin 8.7 (L) 12.0 - 15.0 g/dL   HCT 33.3 (L) 54.5 - 62.5 %   MCV 77.7 (L) 78.0 - 100.0 fL   MCH 25.2 (L) 26.0 - 34.0 pg   MCHC 32.5 30.0 - 36.0 g/dL   RDW 63.8 (H) 93.7 - 34.2 %   Platelets 241 150 - 400 K/uL     Recent Labs  06/30/16 2327 07/01/16 1521  HGB 8.7* 8.7*  HCT 26.1* 26.8*    Assessment/Plan: Debbie Wood is a 25yo AA female G3P3002 PPD#1 SVD, IOL for GHTN  -GHTN: BPs slightly below 140/90. Continue to monitor. Change pain medicine   from ibuprofen to tylenol.  - Anemia: Asymptomatic. Continue iron supplements.   - D/c tomorrow since baby has been febrile and needs to stay for obs.    LOS: 1 day   [name] 07/02/2016, 7:36 AM   CNM attestation Post Partum Day #1  Debbie Wood is a 25 y.o. A7G8115 s/p SVD.  Pt denies problems with ambulating, voiding or po intake. Pain  is well controlled.  Plan for birth control is oral progesterone-only contraceptive.  Method of Feeding: breast (may switch to bottle at home)  PE:  BP 139/69 (BP Location: Left Arm)   Pulse 88   Temp 98.3 F (36.8 C) (Oral)   Resp 18   Ht 5\' 3"  (1.6 m)   Wt 77.6 kg (171 lb)   LMP 09/30/2015   Breastfeeding? Unknown   BMI 30.29 kg/m  Fundus firm  Plan for discharge: 07/03/16 Motrin d/c'ed due to borderline BPs- can use Tylenol or Daryll Brod, CNM 9:03 AM  07/02/2016

## 2016-07-03 MED ORDER — LABETALOL HCL 200 MG PO TABS: 200.0000 mg | ORAL_TABLET | Freq: Three times a day (TID) | ORAL | Status: DC

## 2016-07-03 MED ORDER — LABETALOL HCL 200 MG PO TABS: 200.0000 mg | ORAL_TABLET | Freq: Three times a day (TID) | ORAL | 1 refills | Status: DC

## 2016-07-03 MED ORDER — COCONUT OIL OIL: 1.0000 "application " | TOPICAL_OIL | 99 refills | Status: DC | PRN

## 2016-07-03 MED ORDER — OXYCODONE-ACETAMINOPHEN 5-325 MG PO TABS: 1.0000 | ORAL_TABLET | ORAL | 0 refills | Status: DC | PRN

## 2016-07-03 MED ORDER — SENNOSIDES-DOCUSATE SODIUM 8.6-50 MG PO TABS: 2.0000 | ORAL_TABLET | Freq: Two times a day (BID) | ORAL | 1 refills | Status: DC

## 2016-07-03 MED ORDER — POLYSACCHARIDE IRON COMPLEX 150 MG PO CAPS: 150.0000 mg | ORAL_CAPSULE | Freq: Every day | ORAL | Status: DC

## 2016-07-03 NOTE — Lactation Note (Signed)
This note was copied from a baby's chart. Lactation Consultation Note  Patient Name: Debbie Wood YHCWC'B Date: 07/03/2016 Reason for consult: Follow-up assessment;Infant weight loss  Baby is 40 hours old, and has picked up with feedings over night per mom and per doc flow sheets. LC updated the doc flow sheets at consult.  Baby awake and rooting, LC assisted and showed mom how to use the cross cradle position and go to cradle .  Depth obtained and baby fed for 15 mins , multiply swallows , increased with breast compressions.  Nipple well rounded when abby released. Skin to skin feeding.  LC reviewed basics of latching, and prevention and tx reviewed with mom.  LC instructed mom on the use of hand pump, increased flange to #27 for when milk comes in.  Also instructed on the use shells. Referring to the Baby and me booklet pages 24 -25 .  Per mom active with WIC . LC reminded mom they are a resource also. Mother informed of post-discharge support and given phone number to the lactation department, including services for phone  call assistance; out-patient appointments; and breastfeeding support group. List of other breastfeeding resources in the community  given in the handout. Encouraged mother to call for problems or concerns related to breastfeeding.   Maternal Data Formula Feeding for Exclusion: No Has patient been taught Hand Expression?: Yes Does the patient have breastfeeding experience prior to this delivery?: Yes  Feeding Feeding Type: Breast Fed Length of feed: 15 min  LATCH Score/Interventions Latch: Grasps breast easily, tongue down, lips flanged, rhythmical sucking. Intervention(s): Teach feeding cues;Waking techniques;Skin to skin Intervention(s): Adjust position;Assist with latch;Breast massage;Breast compression  Audible Swallowing: Spontaneous and intermittent  Type of Nipple: Everted at rest and after stimulation  Comfort (Breast/Nipple): Soft /  non-tender     Hold (Positioning): Assistance needed to correctly position infant at breast and maintain latch. Intervention(s): Breastfeeding basics reviewed;Support Pillows;Position options;Skin to skin  LATCH Score: 9  Lactation Tools Discussed/Used Tools: Shells;Pump;Flanges Flange Size: 27 Shell Type: Inverted Pump Review: Setup, frequency, and cleaning Initiated by:: MAI  Date initiated:: 07/03/16   Consult Status Consult Status: Complete Date: 07/03/16 Follow-up type: In-patient    Kathrin Greathouse 07/03/2016, 9:47 AM

## 2016-07-03 NOTE — Discharge Summary (Signed)
Obstetric Discharge Summary Reason for Admission: onset of labor Prenatal Procedures: ultrasound Intrapartum Procedures: spontaneous vaginal delivery Postpartum Procedures: none Complications-Operative and Postpartum: none Hemoglobin  Date Value Ref Range Status  07/01/2016 8.7 (L) 12.0 - 15.0 g/dL Final   HCT  Date Value Ref Range Status  07/01/2016 26.8 (L) 36.0 - 46.0 % Final   Hematocrit  Date Value Ref Range Status  04/12/2016 27.9 (L) 34.0 - 46.6 % Final    Physical Exam:  General: alert, cooperative and no distress Lochia: appropriate Uterine Fundus: firm Incision: none DVT Evaluation: No evidence of DVT seen on physical exam. Negative Homan's sign. No cords or calf tenderness. No significant calf/ankle edema.  Discharge Diagnoses: Term Pregnancy-delivered  Discharge Information: Date: 07/03/2016 Activity: pelvic rest Diet: routine Medications: PNV, Ibuprofen, Colace, Iron and Percocet Condition: stable Instructions: refer to practice specific booklet Discharge to: home Follow-up Information    Rachelle A Denney, CNM Follow up in 2 week(s).   Specialty:  Certified Nurse Midwife Contact information: [address] [place] [address] [phone]           Newborn Data: Live born female  Birth Weight: 6 lb 14.1 oz (3120 g) APGAR: 9, 9  Home with mother.  Roe Coombs, CNM 07/03/2016, 8:52 AM

## 2016-07-17 ENCOUNTER — Ambulatory Visit: Payer: Medicaid Other | Admitting: Certified Nurse Midwife

## 2017-01-09 ENCOUNTER — Other Ambulatory Visit (HOSPITAL_COMMUNITY)
Admission: RE | Admit: 2017-01-09 | Discharge: 2017-01-09 | Disposition: A | Discharge status: Home / Self Care | Payer: 59 | Source: Ambulatory Visit | Attending: Obstetrics | Admitting: Obstetrics

## 2017-01-09 ENCOUNTER — Ambulatory Visit (INDEPENDENT_AMBULATORY_CARE_PROVIDER_SITE_OTHER): Payer: Medicaid Other | Admitting: Obstetrics

## 2017-01-09 ENCOUNTER — Encounter: Payer: Self-pay | Admitting: Obstetrics

## 2017-01-09 VITALS — BP 122/75 | HR 109 | Wt 142.8 lb

## 2017-01-09 DIAGNOSIS — Z113 Encounter for screening for infections with a predominantly sexual mode of transmission: Secondary | ICD-10-CM | POA: Insufficient documentation

## 2017-01-09 DIAGNOSIS — N390 Urinary tract infection, site not specified: Secondary | ICD-10-CM

## 2017-01-09 DIAGNOSIS — Z1151 Encounter for screening for human papillomavirus (HPV): Secondary | ICD-10-CM | POA: Insufficient documentation

## 2017-01-09 DIAGNOSIS — Z0000 Encounter for general adult medical examination without abnormal findings: Secondary | ICD-10-CM

## 2017-01-09 DIAGNOSIS — Z01419 Encounter for gynecological examination (general) (routine) without abnormal findings: Secondary | ICD-10-CM

## 2017-01-09 DIAGNOSIS — Z30011 Encounter for initial prescription of contraceptive pills: Secondary | ICD-10-CM

## 2017-01-09 DIAGNOSIS — Z124 Encounter for screening for malignant neoplasm of cervix: Secondary | ICD-10-CM

## 2017-01-09 DIAGNOSIS — Z3009 Encounter for other general counseling and advice on contraception: Secondary | ICD-10-CM

## 2017-01-09 LAB — POCT URINALYSIS DIPSTICK
Bilirubin, UA: 2
Blood, UA: NEGATIVE
Glucose, UA: NEGATIVE
LEUKOCYTES UA: NEGATIVE
NITRITE UA: NEGATIVE
PH UA: 6.5
Spec Grav, UA: 1.01
UROBILINOGEN UA: NEGATIVE

## 2017-01-09 MED ORDER — LO LOESTRIN FE 1 MG-10 MCG / 10 MCG PO TABS: 1.0000 | ORAL_TABLET | Freq: Every day | ORAL | 4 refills | Status: DC

## 2017-01-09 NOTE — Progress Notes (Signed)
Patient is in the office for annual exam- patient reports she is doing well. She is having some urinary pressure.

## 2017-01-09 NOTE — Progress Notes (Signed)
Subjective:        Debbie Wood is a 26 y.o. female here for a routine exam.  Current complaints: Difficulty starting stream with urination and hard to hold urine with full bladder.    Personal health questionnaire:  Is patient Ashkenazi Jewish, have a family history of breast and/or ovarian cancer: no Is there a family history of uterine cancer diagnosed at age < 5150, gastrointestinal cancer, urinary tract cancer, family member who is a Personnel officerLynch syndrome-associated carrier: no Is the patient overweight and hypertensive, family history of diabetes, personal history of gestational diabetes, preeclampsia or PCOS: no Is patient over 4255, have PCOS,  family history of premature CHD under age 28, diabetes, smoke, have hypertension or peripheral artery disease:  no At any time, has a partner hit, kicked or otherwise hurt or frightened you?: no Over the past 2 weeks, have you felt down, depressed or hopeless?: no Over the past 2 weeks, have you felt little interest or pleasure in doing things?:no   Gynecologic History Patient's last menstrual period was 12/26/2016 (exact date). Contraception: none Last Pap: 2017. Results were: normal Last mammogram: n/a. Results were: n/a  Obstetric History OB History  Gravida Para Term Preterm AB Living  3 3 3  0 0 2  SAB TAB Ectopic Multiple Live Births  0 0 0 0 3    # Outcome Date GA Lbr Len/2nd Weight Sex Delivery Anes PTL Lv  3 Term 07/01/16 6233w2d 01:58 / 00:05 6 lb 14.1 oz (3.12 kg) M Vag-Spont EPI  LIV     Birth Comments: birthmark on center of chest  2 Term 04/08/11 4646w0d  6 lb 7 oz (2.92 kg) M Vag-Spont EPI N LIV  1 Term 01/03/09 5572w0d  6 lb 3 oz (2.807 kg) M Vag-Spont None  ND      Past Medical History:  Diagnosis Date  . Chest pain   . Heart murmur     Past Surgical History:  Procedure Laterality Date  . NO PAST SURGERIES       Current Outpatient Prescriptions:  .  LO LOESTRIN FE 1 MG-10 MCG / 10 MCG tablet, Take 1 tablet by  mouth daily. Start taking pill on 1st day of period., Disp: 3 Package, Rfl: 4 No current facility-administered medications for this visit.   Facility-Administered Medications Ordered in Other Visits:  .  lidocaine (PF) (XYLOCAINE) 1 % injection, , , Anesthesia Intra-op, Karie SchwalbeMary Judd, MD, 4 mL at 07/01/16 1601 No Known Allergies  Social History  Substance Use Topics  . Smoking status: Former Smoker    Types: Cigarettes    Quit date: 06/03/2009  . Smokeless tobacco: Never Used  . Alcohol use No    Family History  Problem Relation Age of Onset  . Hypertension Mother   . Hyperlipidemia Mother   . Hypertension Father   . Heart attack Neg Hx       Review of Systems  Constitutional: negative for fatigue and weight loss Respiratory: negative for cough and wheezing Cardiovascular: negative for chest pain, fatigue and palpitations Gastrointestinal: negative for abdominal pain and change in bowel habits Musculoskeletal:negative for myalgias Neurological: negative for gait problems and tremors Behavioral/Psych: negative for abusive relationship, depression Endocrine: negative for temperature intolerance    Genitourinary:negative for abnormal menstrual periods, genital lesions, hot flashes, sexual problems and vaginal discharge.                        Positive for dysuria Integument/breast:  negative for breast lump, breast tenderness, nipple discharge and skin lesion(s)    Objective:       BP 122/75   Pulse (!) 109   Wt 142 lb 12.8 oz (64.8 kg)   LMP 12/26/2016 (Exact Date)   Breastfeeding? No   BMI 25.30 kg/m  General:   alert  Skin:   no rash or abnormalities  Lungs:   clear to auscultation bilaterally  Heart:   regular rate and rhythm, S1, S2 normal, no murmur, click, rub or gallop  Breasts:   normal without suspicious masses, skin or nipple changes or axillary nodes  Abdomen:  normal findings: no organomegaly, soft, non-tender and no hernia  Pelvis:  External genitalia: normal  general appearance Urinary system: urethral meatus normal and bladder without fullness, nontender Vaginal: normal without tenderness, induration or masses Cervix: normal appearance Adnexa: normal bimanual exam Uterus: anteverted and non-tender, normal size   Lab Review Urine pregnancy test Labs reviewed yes Radiologic studies reviewed no  50% of 20 min visit spent on counseling and coordination of care.    Assessment:    Healthy female exam.    Dysuria  Contraceptive Counseling and Advoice   Plan:    Lo Loestrin Fe 24 Rx  Education reviewed: calcium supplements, low fat, low cholesterol diet, safe sex/STD prevention, self breast exams and weight bearing exercise. Contraception: OCP (estrogen/progesterone). Follow up in: 1 year.   Meds ordered this encounter  Medications  . LO LOESTRIN FE 1 MG-10 MCG / 10 MCG tablet    Sig: Take 1 tablet by mouth daily. Start taking pill on 1st day of period.    Dispense:  3 Package    Refill:  4    Submit other coverage code 3  BIN:  [id number]  PCN:  [id number]   GRP:  [id number]   ID:  [id number]   No orders of the defined types were placed in this encounter.    Patient ID: Debbie Wood, female   DOB: Feb 12, 1991, 26 y.o.   MRN: [id number]

## 2017-01-09 NOTE — Addendum Note (Signed)
Addended by: Elby BeckPAUL, JANE F on: 01/09/2017 10:37 AM   Modules accepted: Orders

## 2017-01-10 LAB — CYTOLOGY - PAP
Diagnosis: NEGATIVE
HPV (WINDOPATH): NOT DETECTED

## 2017-01-10 LAB — CERVICOVAGINAL ANCILLARY ONLY
Bacterial vaginitis: NEGATIVE
CANDIDA VAGINITIS: NEGATIVE
Chlamydia: NEGATIVE
NEISSERIA GONORRHEA: NEGATIVE
TRICH (WINDOWPATH): NEGATIVE

## 2017-01-11 LAB — URINE CULTURE: ORGANISM ID, BACTERIA: NO GROWTH

## 2017-03-11 ENCOUNTER — Emergency Department (HOSPITAL_COMMUNITY)
Admission: EM | Admit: 2017-03-11 | Discharge: 2017-03-12 | Disposition: A | Discharge status: Home / Self Care | Payer: Medicaid Other | Attending: Emergency Medicine | Admitting: Emergency Medicine

## 2017-03-11 ENCOUNTER — Encounter (HOSPITAL_COMMUNITY): Payer: Self-pay | Admitting: Emergency Medicine

## 2017-03-11 DIAGNOSIS — F419 Anxiety disorder, unspecified: Secondary | ICD-10-CM

## 2017-03-11 DIAGNOSIS — Z87891 Personal history of nicotine dependence: Secondary | ICD-10-CM | POA: Insufficient documentation

## 2017-03-11 DIAGNOSIS — F418 Other specified anxiety disorders: Secondary | ICD-10-CM | POA: Diagnosis not present

## 2017-03-11 DIAGNOSIS — F329 Major depressive disorder, single episode, unspecified: Secondary | ICD-10-CM | POA: Diagnosis present

## 2017-03-11 DIAGNOSIS — Z5181 Encounter for therapeutic drug level monitoring: Secondary | ICD-10-CM | POA: Diagnosis not present

## 2017-03-11 HISTORY — DX: Unspecified maternal hypertension, third trimester: O16.3

## 2017-03-11 LAB — CBC
HCT: 39.1 % (ref 36.0–46.0)
Hemoglobin: 13.1 g/dL (ref 12.0–15.0)
MCH: 27.4 pg (ref 26.0–34.0)
MCHC: 33.5 g/dL (ref 30.0–36.0)
MCV: 81.8 fL (ref 78.0–100.0)
PLATELETS: 294 10*3/uL (ref 150–400)
RBC: 4.78 MIL/uL (ref 3.87–5.11)
RDW: 15.9 % — ABNORMAL HIGH (ref 11.5–15.5)
WBC: 14.1 10*3/uL — ABNORMAL HIGH (ref 4.0–10.5)

## 2017-03-11 LAB — BASIC METABOLIC PANEL
Anion gap: 12 (ref 5–15)
BUN: <5 mg/dL — ABNORMAL LOW (ref 6–20)
CO2: 22 mmol/L (ref 22–32)
Calcium: 9.8 mg/dL (ref 8.9–10.3)
Chloride: 106 mmol/L (ref 101–111)
Creatinine, Ser: 0.63 mg/dL (ref 0.44–1.00)
Glucose, Bld: 99 mg/dL (ref 65–99)
POTASSIUM: 3.5 mmol/L (ref 3.5–5.1)
SODIUM: 140 mmol/L (ref 135–145)

## 2017-03-11 LAB — RAPID URINE DRUG SCREEN, HOSP PERFORMED
AMPHETAMINES: NOT DETECTED
BENZODIAZEPINES: NOT DETECTED
Barbiturates: NOT DETECTED
Cocaine: NOT DETECTED
OPIATES: NOT DETECTED
Tetrahydrocannabinol: POSITIVE — AB

## 2017-03-11 LAB — ETHANOL: Alcohol, Ethyl (B): <5 mg/dL (ref <5)

## 2017-03-11 LAB — POC URINE PREG, ED: PREG TEST UR: NEGATIVE

## 2017-03-11 NOTE — ED Triage Notes (Signed)
Pt presents to ED for postpartum depression - appears very upset in triage, crying and hyperventilating. Upon consoling patient, she calms down and states that her son is 66 months old, she has never felt this before (second child) and feels like something is wrong - states that during the day, she feels sad but she loves her son and doesn't know why she feels this way. At night, she reports panic attacks and insomnia.

## 2017-03-11 NOTE — ED Provider Notes (Signed)
[facility]-EMERGENCY DEPT Provider Note   CSN: [id number] Arrival date & time: 03/11/17  1918   By signing my name below, I, Clovis Pu, attest that this documentation has been prepared under the direction and in the presence of  Kerrie Buffalo, NP. Electronically Signed: Clovis Pu, ED Scribe. 03/11/17. 8:58 PM.   History   Chief Complaint Chief Complaint  Patient presents with  . Depression    postpartum    HPI Comments:  Debbie Wood is a 26 y.o. female, 8 months postpartum, who presents to the Emergency Department complaining of intermittent episodes of anxiety x 2 weeks. Pt states she has been feeling sad lately and states she has no one to talk to. She notes she was shopping today when she began to have a panic attack which prompted her to come to the ED. She also notes she has only slept for 2 hours in the past 2 days. She states she feels heaviness to her chest when the "attacks" come onand states she has the sensation of difficulty breathing. Once she gets over the episode all the symptoms subside. Pt also reports 2 episodes of vomiting. No alleviating or aggravating factors noted. She denies a hx of similar symptoms ans states she has a 28 year old and did not experience similar symptoms after having this child. Pt states when she was 69 years old she lost her 1st child and was placed on Klonopin but stopped taking this medication and has not taken any medications since then. She denies SI, HI, nausea, fevers, chills, any pain or any other associated symptoms. No other complaints noted at this time. Patient reports that she can not talk to her family about this because they don't believe anyone should have this type of problem. She is afraid that if she talks to them they will think she is not capable of taking care of her children. When this episode began and the patient was so stressed she did take her baby to her mother's house. (the patient's mother's house)  The history is provided  by the patient. No language interpreter was used.  Depression  This is a new problem. The current episode started more than 1 week ago. The problem has not changed since onset.Pertinent negatives include no chest pain, no abdominal pain, no headaches and no shortness of breath. Nothing aggravates the symptoms. Nothing relieves the symptoms. She has tried nothing for the symptoms.    Past Medical History:  Diagnosis Date  . Chest pain   . Heart murmur   . Hypertension affecting pregnancy in third trimester    does not have issues with it anymore    Patient Active Problem List   Diagnosis Date Noted  . Gestational hypertension without significant proteinuria during pregnancy in third trimester, antepartum 07/01/2016  . Encounter for supervision of other normal pregnancy in first trimester 12/23/2015  . Heart murmur     Past Surgical History:  Procedure Laterality Date  . NO PAST SURGERIES      OB History    Gravida Para Term Preterm AB Living   0 0 2   SAB TAB Ectopic Multiple Live Births   0 0 0 0 3       Home Medications    Prior to Admission medications   Medication Sig Start Date End Date Taking? Authorizing Provider  LO LOESTRIN FE 1 MG-10 MCG / 10 MCG tablet Take 1 tablet by mouth daily. Start taking pill on 1st day  of period. 01/09/17  Yes Brock Bad, MD    Family History Family History  Problem Relation Age of Onset  . Hypertension Mother   . Hyperlipidemia Mother   . Hypertension Father   . Heart attack Neg Hx     Social History Social History  Substance Use Topics  . Smoking status: Former Smoker    Types: Cigarettes    Quit date: 06/03/2009  . Smokeless tobacco: Never Used  . Alcohol use No     Allergies   Patient has no known allergies.   Review of Systems Review of Systems  Constitutional: Negative for chills and fever.  Respiratory: Negative for shortness of breath.   Cardiovascular: Negative for chest pain.  Gastrointestinal:  Positive for vomiting. Negative for abdominal pain and nausea.  Musculoskeletal: Negative for arthralgias.  Skin: Negative for rash.  Neurological: Negative for headaches.  Psychiatric/Behavioral: Positive for depression. Negative for confusion and suicidal ideas. The patient is nervous/anxious.      Physical Exam Updated Vital Signs BP (!) 134/96   Pulse 92   Temp 99.1 F (37.3 C) (Oral)   Resp 18   Ht  (1.575 m)   Wt 59 kg   LMP 02/22/2017 (Exact Date)   SpO2 100%   BMI 23.78 kg/m   Physical Exam  Constitutional: She appears well-developed and well-nourished. No distress.  Tearful, anxious   HENT:  Head: Normocephalic and atraumatic.  Eyes: Conjunctivae and EOM are normal.  Neck: Neck supple.  Cardiovascular: Normal rate and regular rhythm.   Pulmonary/Chest: Effort normal. She has no wheezes. She has no rales.  Abdominal: She exhibits no distension.  Musculoskeletal: Normal range of motion.  Neurological: She is alert.  Skin: Skin is warm and dry.  Psychiatric: Her mood appears anxious. She expresses no homicidal and no suicidal ideation.  Tearful  Nursing note and vitals reviewed.    ED Treatments / Results  DIAGNOSTIC STUDIES:  Oxygen Saturation is 100% on RA, normal by my interpretation.    COORDINATION OF CARE:  8:53 PM Discussed treatment plan with pt at bedside and pt agreed to plan.  Labs (all labs ordered are listed, but only abnormal results are displayed) Labs Reviewed  CBC - Abnormal; Notable for the following:       Result Value   WBC 14.1 (*)    RDW 15.9 (*)    All other components within normal limits  BASIC METABOLIC PANEL - Abnormal; Notable for the following:    BUN <5 (*)    All other components within normal limits  RAPID URINE DRUG SCREEN, HOSP PERFORMED - Abnormal; Notable for the following:    Tetrahydrocannabinol POSITIVE (*)    All other components within normal limits  ETHANOL  POC URINE PREG, ED    Radiology No  results found.  Procedures Procedures (including critical care time)  Medications Ordered in ED Medications - No data to display   Initial Impression / Assessment and Plan / ED Course  I have reviewed the triage vital signs and the nursing notes.  Pertinent lab results that were available during my care of the patient were reviewed by me and considered in my medical decision making (see chart for details).   Final Clinical Impressions(s) / ED Diagnoses  26 y.o. female with complaint of feeling anxious that has gotten worse over the past few weeks stable for d/c after interview by [facility] and determined that outpatient treatment is the best option. Discussed with the patient and  all questioned fully answered. She will f/u as discussed with BH or return here sooner if any problems arise.  Final diagnoses:  Anxiety    New Prescriptions Discharge Medication List as of 03/12/2017  3:09 AM    I personally performed the services described in this documentation, which was scribed in my presence. The recorded information has been reviewed and is accurate.     [address] [place], [place] 03/12/17 1610    Shon Baton, MD 03/14/17 [phone]

## 2017-03-12 NOTE — ED Notes (Signed)
Pt verbalized understanding discharge instructions and denies any further needs or questions at this time. VS stable, ambulatory and steady gait.    Pt is going to seek out pt counciling.  Pt states she has no ideations of SI or HI.
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
## 2017-03-12 NOTE — Discharge Instructions (Signed)
Follow up with the out patient counseling as discussed with you earlier. Return for worsening symptoms.

## 2017-03-25 ENCOUNTER — Ambulatory Visit (HOSPITAL_COMMUNITY): Payer: Medicaid Other | Attending: Cardiology

## 2017-03-25 ENCOUNTER — Other Ambulatory Visit: Payer: Self-pay

## 2017-03-25 DIAGNOSIS — I272 Pulmonary hypertension, unspecified: Secondary | ICD-10-CM | POA: Diagnosis not present

## 2017-03-25 DIAGNOSIS — I071 Rheumatic tricuspid insufficiency: Secondary | ICD-10-CM | POA: Diagnosis not present

## 2017-09-21 ENCOUNTER — Emergency Department (HOSPITAL_COMMUNITY): Payer: Medicaid Other

## 2017-09-21 ENCOUNTER — Encounter (HOSPITAL_COMMUNITY): Payer: Self-pay | Admitting: Emergency Medicine

## 2017-09-21 ENCOUNTER — Emergency Department (HOSPITAL_COMMUNITY)
Admission: EM | Admit: 2017-09-21 | Discharge: 2017-09-21 | Disposition: A | Discharge status: Home / Self Care | Payer: Medicaid Other | Attending: Emergency Medicine | Admitting: Emergency Medicine

## 2017-09-21 DIAGNOSIS — Y929 Unspecified place or not applicable: Secondary | ICD-10-CM | POA: Diagnosis not present

## 2017-09-21 DIAGNOSIS — I10 Essential (primary) hypertension: Secondary | ICD-10-CM | POA: Insufficient documentation

## 2017-09-21 DIAGNOSIS — S99921A Unspecified injury of right foot, initial encounter: Secondary | ICD-10-CM | POA: Diagnosis present

## 2017-09-21 DIAGNOSIS — S9031XA Contusion of right foot, initial encounter: Secondary | ICD-10-CM | POA: Diagnosis not present

## 2017-09-21 DIAGNOSIS — Z79899 Other long term (current) drug therapy: Secondary | ICD-10-CM | POA: Diagnosis not present

## 2017-09-21 DIAGNOSIS — Y999 Unspecified external cause status: Secondary | ICD-10-CM | POA: Insufficient documentation

## 2017-09-21 DIAGNOSIS — W2209XA Striking against other stationary object, initial encounter: Secondary | ICD-10-CM | POA: Diagnosis not present

## 2017-09-21 DIAGNOSIS — Y9301 Activity, walking, marching and hiking: Secondary | ICD-10-CM | POA: Diagnosis not present

## 2017-09-21 DIAGNOSIS — Z87891 Personal history of nicotine dependence: Secondary | ICD-10-CM | POA: Insufficient documentation

## 2017-09-21 NOTE — ED Notes (Signed)
Pt family came to desk asking when someone will be in to see them, explained to them there are other pts and they would be in as soon as they could.

## 2017-09-21 NOTE — ED Provider Notes (Signed)
[facility] EMERGENCY DEPARTMENT Provider Note   CSN: [id number] Arrival date & time: 09/21/17  1344     History   Chief Complaint Chief Complaint  Patient presents with  . Foot Pain  . Foot Injury    HPI Debbie Wood is a 26 y.o. female.  HPI   Patient presents with right foot pain after tripping on the stairs and hitting the top of her foot.  Has pain and an indention over the dorsal foot and tinging in her toes.  No other injury.  No break in skin.    Past Medical History:  Diagnosis Date  . Chest pain   . Heart murmur   . Hypertension affecting pregnancy in third trimester    does not have issues with it anymore    Patient Active Problem List   Diagnosis Date Noted  . Gestational hypertension without significant proteinuria during pregnancy in third trimester, antepartum 07/01/2016  . Encounter for supervision of other normal pregnancy in first trimester 12/23/2015  . Heart murmur     Past Surgical History:  Procedure Laterality Date  . NO PAST SURGERIES      OB History    Gravida Para Term Preterm AB Living   3 3 3  0 0 2   SAB TAB Ectopic Multiple Live Births   0 0 0 0 3       Home Medications    Prior to Admission medications   Medication Sig Start Date End Date Taking? Authorizing Provider  LO LOESTRIN FE 1 MG-10 MCG / 10 MCG tablet Take 1 tablet by mouth daily. Start taking pill on 1st day of period. 01/09/17   Brock Bad, MD    Family History Family History  Problem Relation Age of Onset  . Hypertension Mother   . Hyperlipidemia Mother   . Hypertension Father   . Heart attack Neg Hx     Social History Social History  Substance Use Topics  . Smoking status: Former Smoker    Types: Cigarettes    Quit date: 06/03/2009  . Smokeless tobacco: Never Used  . Alcohol use No     Allergies   Patient has no known allergies.   Review of Systems Review of Systems  Constitutional: Negative for fever.    Musculoskeletal: Positive for arthralgias.  Neurological: Negative for weakness.     Physical Exam Updated Vital Signs BP 118/85 (BP Location: Left Arm)   Pulse (!) 115   Temp 98.5 F (36.9 C)   Resp 17   Ht 5\' 2"  (1.575 m)   Wt 57.2 kg (126 lb)   LMP 08/20/2017 (Approximate)   SpO2 100%   BMI 23.05 kg/m   Physical Exam  Constitutional: She appears well-developed and well-nourished.  HENT:  Head: Normocephalic and atraumatic.  Neck: Neck supple.  Pulmonary/Chest: Effort normal.  Musculoskeletal:       Right ankle: Normal.       Right foot: There is tenderness. There is normal range of motion, normal capillary refill, no deformity and no laceration.       Feet:  Neurological: She is alert.  Nursing note and vitals reviewed.    ED Treatments / Results  Labs (all labs ordered are listed, but only abnormal results are displayed) Labs Reviewed - No data to display  EKG  EKG Interpretation None       Radiology Dg Foot Complete Right  Result Date: 09/21/2017 CLINICAL DATA:  Pain after trauma. EXAM: RIGHT FOOT  COMPLETE - 3+ VIEW COMPARISON:  None. FINDINGS: The distal first phalanx evaluations is limited due to rounded opacities on the patient's first toenail. On the lateral view, there is no clear fracture in this region. No acute fractures noted. IMPRESSION: Evaluation of the distal first phalanx is limited as above. No acute abnormalities are seen on today's study. Electronically Signed   By: Gerome Samavid  Williams III M.D   On: 09/21/2017 14:30    Procedures Procedures (including critical care time)  Medications Ordered in ED Medications - No data to display   Initial Impression / Assessment and Plan / ED Course  I have reviewed the triage vital signs and the nursing notes.  Pertinent labs & imaging results that were available during my care of the patient were reviewed by me and considered in my medical decision making (see chart for details).     Afebrile,  nontoxic patient with injury to her right foot while tripping on a stair.  No twisting injury.  Doubt sprain/strain.  Likely contusion.   Xray negative.   D/C home with ace wrap, RICE instructions, OTC medications as needed.  Discussed result, findings, treatment, and follow up  with patient.  Pt given return precautions.  Pt verbalizes understanding and agrees with plan.        Final Clinical Impressions(s) / ED Diagnoses   Final diagnoses:  Contusion of right foot, initial encounter    New Prescriptions New Prescriptions   No medications on file     Trixie DredgeWest, Francia Verry, [place]PA-C 09/21/17 1559    Rolan BuccoBelfi, Melanie, MD 09/21/17 [phone]

## 2017-09-21 NOTE — ED Triage Notes (Signed)
Pt. Stated, I was bringing in groceries and triped and fell, hurt my rt. Foot.

## 2018-03-07 ENCOUNTER — Encounter: Payer: Self-pay | Admitting: Obstetrics and Gynecology

## 2018-03-07 ENCOUNTER — Other Ambulatory Visit (HOSPITAL_COMMUNITY)
Admission: RE | Admit: 2018-03-07 | Discharge: 2018-03-07 | Disposition: A | Discharge status: Home / Self Care | Payer: Medicaid Other | Source: Ambulatory Visit | Attending: Obstetrics | Admitting: Obstetrics

## 2018-03-07 ENCOUNTER — Ambulatory Visit: Payer: Medicaid Other | Admitting: Obstetrics

## 2018-03-07 VITALS — BP 116/76 | HR 83 | Ht 63.0 in | Wt 117.0 lb

## 2018-03-07 DIAGNOSIS — Z0000 Encounter for general adult medical examination without abnormal findings: Secondary | ICD-10-CM | POA: Diagnosis not present

## 2018-03-07 DIAGNOSIS — Z124 Encounter for screening for malignant neoplasm of cervix: Secondary | ICD-10-CM

## 2018-03-07 DIAGNOSIS — N898 Other specified noninflammatory disorders of vagina: Secondary | ICD-10-CM

## 2018-03-07 DIAGNOSIS — Z113 Encounter for screening for infections with a predominantly sexual mode of transmission: Secondary | ICD-10-CM

## 2018-03-07 DIAGNOSIS — Z3041 Encounter for surveillance of contraceptive pills: Secondary | ICD-10-CM

## 2018-03-07 DIAGNOSIS — A549 Gonococcal infection, unspecified: Secondary | ICD-10-CM

## 2018-03-07 DIAGNOSIS — Z01419 Encounter for gynecological examination (general) (routine) without abnormal findings: Secondary | ICD-10-CM

## 2018-03-07 MED ORDER — AZITHROMYCIN 500 MG PO TABS: 1000.0000 mg | ORAL_TABLET | Freq: Once | ORAL | 0 refills | Status: AC

## 2018-03-07 MED ORDER — CEFTRIAXONE SODIUM 250 MG IJ SOLR
250.0000 mg | Freq: Once | INTRAMUSCULAR | Status: AC
  Administered 2018-03-07: 250 mg via INTRAMUSCULAR

## 2018-03-07 MED ORDER — CEFTRIAXONE SODIUM 1 G IJ SOLR: 250.0000 mg | INTRAMUSCULAR | Status: DC

## 2018-03-07 NOTE — Progress Notes (Signed)
Patient presents for Annual Exam today. LMP:02/25/18 Contraception:None  Last pap: 01/09/2017 WNL  Pt states she had abortion October 2018  Recently informed her boyfriend tested positive for GC wants full panel STD testing.

## 2018-03-07 NOTE — Progress Notes (Signed)
Subjective:        Debbie Wood is a 27 y.o. female here for a routine exam.  Current complaints: Suspected GC contact.    Personal health questionnaire:  Is patient Ashkenazi Jewish, have a family history of breast and/or ovarian cancer: no Is there a family history of uterine cancer diagnosed at age < 21, gastrointestinal cancer, urinary tract cancer, family member who is a Personnel officer syndrome-associated carrier: no Is the patient overweight and hypertensive, family history of diabetes, personal history of gestational diabetes, preeclampsia or PCOS: no Is patient over 68, have PCOS,  family history of premature CHD under age 25, diabetes, smoke, have hypertension or peripheral artery disease:  no At any time, has a partner hit, kicked or otherwise hurt or frightened you?: no Over the past 2 weeks, have you felt down, depressed or hopeless?: no Over the past 2 weeks, have you felt little interest or pleasure in doing things?:no   Gynecologic History Patient's last menstrual period was 02/25/2018 (exact date). Contraception: condoms Last Pap: 2018. Results were: normal Last mammogram: n/a. Results were: n/a  Obstetric History OB History  Gravida Para Term Preterm AB Living  4 3 3  0 1 2  SAB TAB Ectopic Multiple Live Births  0 1 0 0 3    # Outcome Date GA Lbr Len/2nd Weight Sex Delivery Anes PTL Lv  4 Term 07/01/16 [redacted]w[redacted]d 01:58 / 00:05 6 lb 14.1 oz (3.12 kg) M Vag-Spont EPI  LIV     Birth Comments: birthmark on center of chest  3 Term 04/08/11 [redacted]w[redacted]d  6 lb 7 oz (2.92 kg) M Vag-Spont EPI N LIV  2 Term 01/03/09 [redacted]w[redacted]d  6 lb 3 oz (2.807 kg) M Vag-Spont None  ND  1 TAB             Past Medical History:  Diagnosis Date  . Chest pain   . Heart murmur   . Hypertension affecting pregnancy in third trimester    does not have issues with it anymore    Past Surgical History:  Procedure Laterality Date  . NO PAST SURGERIES       Current Outpatient Medications:  .   azithromycin (ZITHROMAX) 500 MG tablet, Take 2 tablets (1,000 mg total) by mouth once for 1 dose., Disp: 2 tablet, Rfl: 0 .  LO LOESTRIN FE 1 MG-10 MCG / 10 MCG tablet, Take 1 tablet by mouth daily. Start taking pill on 1st day of period. (Patient not taking: Reported on 03/07/2018), Disp: 3 Package, Rfl: 4  Current Facility-Administered Medications:  .  cefTRIAXone (ROCEPHIN) injection 250 mg, 250 mg, Intramuscular, Once, Brock Bad, MD  Facility-Administered Medications Ordered in Other Visits:  .  lidocaine (PF) (XYLOCAINE) 1 % injection, , , Anesthesia Intra-op, Karie Schwalbe, MD, 4 mL at 07/01/16 1601 No Known Allergies  Social History   Tobacco Use  . Smoking status: Former Smoker    Types: Cigarettes    Last attempt to quit: 06/03/2009    Years since quitting: 8.7  . Smokeless tobacco: Never Used  Substance Use Topics  . Alcohol use: No    Family History  Problem Relation Age of Onset  . Hypertension Mother   . Hyperlipidemia Mother   . Hypertension Father   . Heart attack Neg Hx       Review of Systems  Constitutional: negative for fatigue and weight loss Respiratory: negative for cough and wheezing Cardiovascular: negative for chest pain, fatigue and palpitations Gastrointestinal: negative  for abdominal pain and change in bowel habits Musculoskeletal:negative for myalgias Neurological: negative for gait problems and tremors Behavioral/Psych: negative for abusive relationship, depression Endocrine: negative for temperature intolerance    Genitourinary:negative for abnormal menstrual periods, genital lesions, hot flashes, sexual problems and vaginal discharge Integument/breast: negative for breast lump, breast tenderness, nipple discharge and skin lesion(s)    Objective:       BP 116/76   Pulse 83   Ht 5\' 3"  (1.6 m)   Wt 117 lb (53.1 kg)   LMP 02/25/2018 (Exact Date)   BMI 20.73 kg/m  General:   alert  Skin:   no rash or abnormalities  Lungs:   clear to  auscultation bilaterally  Heart:   regular rate and rhythm, S1, S2 normal, no murmur, click, rub or gallop  Breasts:   normal without suspicious masses, skin or nipple changes or axillary nodes  Abdomen:  normal findings: no organomegaly, soft, non-tender and no hernia  Pelvis:  External genitalia: normal general appearance Urinary system: urethral meatus normal and bladder without fullness, nontender Vaginal: normal without tenderness, induration or masses Cervix: normal appearance Adnexa: normal bimanual exam Uterus: anteverted and non-tender, normal size   Lab Review Urine pregnancy test Labs reviewed yes Radiologic studies reviewed no  50% of 20 min visit spent on counseling and coordination of care.   Assessment:     1. Encounter for annual routine gynecological examination   2. Screening for cervical cancer Rx: - Cytology - PAP  3. Screening examination for STD (sexually transmitted disease) Rx: - Hepatitis B surface antigen - Hepatitis C antibody - HIV antibody - RPR  4. Vaginal discharge Rx: - Cervicovaginal ancillary only  5. GC (gonococcus infection) Rx: - cefTRIAXone (ROCEPHIN) injection 250 mg - azithromycin (ZITHROMAX) 500 MG tablet; Take 2 tablets (1,000 mg total) by mouth once for 1 dose.  Dispense: 2 tablet; Refill: 0  6. Encounter for surveillance of contraceptive pills - stopped taking OCP's.  Wants to use condoms only.     Plan:    Education reviewed: calcium supplements, depression evaluation, low fat, low cholesterol diet, safe sex/STD prevention, self breast exams and weight bearing exercise. Contraception: condoms. Follow up in: 1 year.   Meds ordered this encounter  Medications  . cefTRIAXone (ROCEPHIN) injection 250 mg  . azithromycin (ZITHROMAX) 500 MG tablet    Sig: Take 2 tablets (1,000 mg total) by mouth once for 1 dose.    Dispense:  2 tablet    Refill:  0  . DISCONTD: cefTRIAXone (ROCEPHIN) injection 250 mg    Order Specific  Question:   Antibiotic Indication:    Answer:   STD   Orders Placed This Encounter  Procedures  . Hepatitis B surface antigen  . Hepatitis C antibody  . HIV antibody  . RPR    Brock BadHARLES A. Chamaine Stankus MD 03-07-2018

## 2018-03-08 LAB — RPR: RPR: NONREACTIVE

## 2018-03-08 LAB — HIV ANTIBODY (ROUTINE TESTING W REFLEX): HIV Screen 4th Generation wRfx: NONREACTIVE

## 2018-03-08 LAB — HEPATITIS B SURFACE ANTIGEN: HEP B S AG: NEGATIVE

## 2018-03-08 LAB — HEPATITIS C ANTIBODY: Hep C Virus Ab: <0.1 {s_co_ratio} (ref 0.0–0.9)

## 2018-03-10 LAB — CYTOLOGY - PAP: Diagnosis: NEGATIVE

## 2018-03-10 LAB — CERVICOVAGINAL ANCILLARY ONLY
BACTERIAL VAGINITIS: POSITIVE — AB
CANDIDA VAGINITIS: NEGATIVE
Chlamydia: NEGATIVE
NEISSERIA GONORRHEA: NEGATIVE
Trichomonas: NEGATIVE

## 2018-03-11 ENCOUNTER — Telehealth: Payer: Self-pay

## 2018-03-11 ENCOUNTER — Other Ambulatory Visit: Payer: Self-pay | Admitting: Obstetrics

## 2018-03-11 DIAGNOSIS — B9689 Other specified bacterial agents as the cause of diseases classified elsewhere: Secondary | ICD-10-CM

## 2018-03-11 DIAGNOSIS — N760 Acute vaginitis: Principal | ICD-10-CM

## 2018-03-11 MED ORDER — SECNIDAZOLE 2 G PO PACK: 1.0000 | PACK | Freq: Once | ORAL | 2 refills | Status: AC

## 2018-03-11 NOTE — Telephone Encounter (Signed)
Patient notified of results and Rx. 

## 2018-03-11 NOTE — Telephone Encounter (Signed)
-----   Message from Brock Badharles A Harper, MD sent at 03/11/2018  5:30 AM EDT ----- Solosec Rx for BV

## 2018-09-01 ENCOUNTER — Encounter: Payer: Medicaid Other | Admitting: Advanced Practice Midwife

## 2018-09-07 ENCOUNTER — Encounter (HOSPITAL_COMMUNITY): Payer: Self-pay

## 2018-09-07 ENCOUNTER — Inpatient Hospital Stay (HOSPITAL_COMMUNITY)
Admission: AD | Admit: 2018-09-07 | Discharge: 2018-09-07 | Disposition: A | Discharge status: Home / Self Care | Payer: Medicaid Other | Source: Ambulatory Visit | Attending: Obstetrics & Gynecology | Admitting: Obstetrics & Gynecology

## 2018-09-07 DIAGNOSIS — N760 Acute vaginitis: Secondary | ICD-10-CM | POA: Diagnosis not present

## 2018-09-07 DIAGNOSIS — O9989 Other specified diseases and conditions complicating pregnancy, childbirth and the puerperium: Secondary | ICD-10-CM | POA: Diagnosis not present

## 2018-09-07 DIAGNOSIS — Z8759 Personal history of other complications of pregnancy, childbirth and the puerperium: Secondary | ICD-10-CM

## 2018-09-07 DIAGNOSIS — O26892 Other specified pregnancy related conditions, second trimester: Secondary | ICD-10-CM | POA: Insufficient documentation

## 2018-09-07 DIAGNOSIS — N949 Unspecified condition associated with female genital organs and menstrual cycle: Secondary | ICD-10-CM | POA: Diagnosis not present

## 2018-09-07 DIAGNOSIS — O23592 Infection of other part of genital tract in pregnancy, second trimester: Secondary | ICD-10-CM | POA: Diagnosis not present

## 2018-09-07 DIAGNOSIS — R1032 Left lower quadrant pain: Secondary | ICD-10-CM | POA: Diagnosis not present

## 2018-09-07 DIAGNOSIS — Z87891 Personal history of nicotine dependence: Secondary | ICD-10-CM | POA: Diagnosis not present

## 2018-09-07 DIAGNOSIS — Z3A16 16 weeks gestation of pregnancy: Secondary | ICD-10-CM | POA: Diagnosis not present

## 2018-09-07 DIAGNOSIS — M549 Dorsalgia, unspecified: Secondary | ICD-10-CM | POA: Diagnosis present

## 2018-09-07 DIAGNOSIS — B9689 Other specified bacterial agents as the cause of diseases classified elsewhere: Secondary | ICD-10-CM

## 2018-09-07 DIAGNOSIS — R109 Unspecified abdominal pain: Secondary | ICD-10-CM | POA: Diagnosis present

## 2018-09-07 LAB — URINALYSIS, ROUTINE W REFLEX MICROSCOPIC
BILIRUBIN URINE: NEGATIVE
Glucose, UA: NEGATIVE mg/dL
HGB URINE DIPSTICK: NEGATIVE
KETONES UR: NEGATIVE mg/dL
NITRITE: NEGATIVE
PROTEIN: NEGATIVE mg/dL
Specific Gravity, Urine: 1.013 (ref 1.005–1.030)
WBC, UA: >50 WBC/hpf — ABNORMAL HIGH (ref 0–5)
pH: 8 (ref 5.0–8.0)

## 2018-09-07 LAB — WET PREP, GENITAL
Sperm: NONE SEEN
TRICH WET PREP: NONE SEEN
Yeast Wet Prep HPF POC: NONE SEEN

## 2018-09-07 LAB — POCT PREGNANCY, URINE: Preg Test, Ur: POSITIVE — AB

## 2018-09-07 MED ORDER — IBUPROFEN 600 MG PO TABS
600.0000 mg | ORAL_TABLET | Freq: Once | ORAL | Status: AC
  Administered 2018-09-07: 600 mg via ORAL
  Filled 2018-09-07: qty 1

## 2018-09-07 MED ORDER — IBUPROFEN 600 MG PO TABS: 600.0000 mg | ORAL_TABLET | Freq: Four times a day (QID) | ORAL | 1 refills | Status: DC | PRN

## 2018-09-07 MED ORDER — CONCEPT OB 130-92.4-1 MG PO CAPS: 1.0000 | ORAL_CAPSULE | Freq: Every day | ORAL | 12 refills | Status: DC

## 2018-09-07 MED ORDER — METRONIDAZOLE 500 MG PO TABS: 500.0000 mg | ORAL_TABLET | Freq: Two times a day (BID) | ORAL | 0 refills | Status: DC

## 2018-09-07 NOTE — MAU Note (Signed)
Started having abdominal and back pain since 1400  No bleeding  LMP 05/13/18 states she has not started [facility] but plans to with [facility]

## 2018-09-07 NOTE — MAU Provider Note (Signed)
Chief Complaint: Abdominal Pain and Back Pain    First Provider Initiated Contact with Patient 09/07/2018 at 1741.    SUBJECTIVE HPI: Debbie Wood is a 27 y.o. [id number] at [redacted]w[redacted]d who presents to Maternity Admissions reporting left lower quadrant and lower abdominal pain since this afternoon.   Quality: Sharp Severity: Moderate Duration: 4 hours Context: Long and physically strenuous day at work yesterday.  Manages a fast Paediatric nurse. Timing: Low abdominal pain is intermittent.  Low back pain is constant Modifying factors: Has not tried anything for the pain. Associated signs and symptoms: Negative for fever, chills, vaginal bleeding, nausea, vomiting, diarrhea, constipation, urinary complaints, leaking of fluid.  Pl[facility] to [facility], but has to reschedule new OB visit.  Past Medical History:  Diagnosis Date  . Chest pain   . Heart murmur   . Hypertension affecting pregnancy in third trimester    does not have issues with it anymore   OB History  Gravida Para Term Preterm AB Living  5 3 3  0 1 2  SAB TAB Ectopic Multiple Live Births  0 1 0 0 3    # Outcome Date GA Lbr Len/2nd Weight Sex Delivery Anes PTL Lv  5 Current           4 TAB 2018          3 Term 07/01/16 [redacted]w[redacted]d 01:58 / 00:05 3120 g M Vag-Spont EPI  LIV     Birth Comments: birthmark on center of chest  2 Term 04/08/11 [redacted]w[redacted]d  2920 g M Vag-Spont EPI N LIV  1 Term 01/03/09 [redacted]w[redacted]d  2807 g M Vag-Spont None  ND   Past Surgical History:  Procedure Laterality Date  . NO PAST SURGERIES     Social History   Socioeconomic History  . Marital status: Single    Spouse name: Not on file  . Number of children: Not on file  . Years of education: Not on file  . Highest education level: Not on file  Occupational History  . Not on file  Social Needs  . Financial resource strain: Not on file  . Food insecurity:    Worry: Not on file    Inability: Not on file  .  Transportation needs:    Medical: Not on file    Non-medical: Not on file  Tobacco Use  . Smoking status: Former Smoker    Types: Cigarettes    Last attempt to quit: 06/03/2009    Years since quitting: 9.2  . Smokeless tobacco: Never Used  Substance and Sexual Activity  . Alcohol use: No  . Drug use: No  . Sexual activity: Yes    Partners: Male    Birth control/protection: None  Lifestyle  . Physical activity:    Days per week: Not on file    Minutes per session: Not on file  . Stress: Not on file  Relationships  . Social connections:    Talks on phone: Not on file    Gets together: Not on file    Attends religious service: Not on file    Active member of club or organization: Not on file    Attends meetings of clubs or organizations: Not on file    Relationship status: Not on file  . Intimate partner violence:    Fear of current or ex partner: Not on file    Emotionally abused: Not on file    Physically abused: Not on file  Forced sexual activity: Not on file  Other Topics Concern  . Not on file  Social History Narrative  . Not on file   Family History  Problem Relation Age of Onset  . Hypertension Mother   . Hyperlipidemia Mother   . Hypertension Father   . Heart attack Neg Hx    Current Facility-Administered Medications on File Prior to Encounter  Medication Dose Route Frequency Provider Last Rate Last Dose  . lidocaine (PF) (XYLOCAINE) 1 % injection    Anesthesia Intra-op Karie Schwalbe, MD   4 mL at 07/01/16 1601   Current Outpatient Medications on File Prior to Encounter  Medication Sig Dispense Refill  . LO LOESTRIN FE 1 MG-10 MCG / 10 MCG tablet Take 1 tablet by mouth daily. Start taking pill on 1st day of period. (Patient not taking: Reported on 03/07/2018) 3 Package 4   No Known Allergies  I have reviewed patient's Past Medical Hx, Surgical Hx, Family Hx, Social Hx, medications and allergies.   Review of Systems  Constitutional: Negative for chills and  fever.  Gastrointestinal: Positive for nausea. Negative for abdominal pain, blood in stool, constipation, diarrhea and vomiting.  Genitourinary: Negative for difficulty urinating, dysuria, flank pain, frequency, hematuria, pelvic pain, vaginal bleeding and vaginal discharge.  Musculoskeletal: Positive for back pain. Negative for myalgias and neck pain.    OBJECTIVE Patient Vitals for the past 24 hrs:  BP Temp Temp src Pulse Resp Weight  09/07/18 1645 (!) 108/57 98.4 F (36.9 C) Oral 88 18 -  09/07/18 1631 - - - - - 52.2 kg   Constitutional: Well-developed, well-nourished female in no acute distress.  Cardiovascular: normal rate Respiratory: normal rate and effort.  GI: Abd soft, non-tender, gravid appropriate for gestational age. Pos BS x 4 MS: Extremities nontender, no edema, normal ROM Neurologic: Alert and oriented x 4.  GU: Neg CVAT.  SPECULUM EXAM: NEFG, moderate amount of thin, white, mildly malodorous discharge, no blood noted, cervix clean  BIMANUAL: cervix long and closed; uterus normal size, no adnexal tenderness or masses. No CMT.  FHR 140 by doppler.   LAB RESULTS Results for orders placed or performed during the hospital encounter of 09/07/18 (from the past 24 hour(s))  Urinalysis, Routine w reflex microscopic     Status: Abnormal   Collection Time: 09/07/18  4:29 PM  Result Value Ref Range   Color, Urine YELLOW YELLOW   APPearance HAZY (A) CLEAR   Specific Gravity, Urine 1.013 1.005 - 1.030   pH 8.0 5.0 - 8.0   Glucose, UA NEGATIVE NEGATIVE mg/dL   Hgb urine dipstick NEGATIVE NEGATIVE   Bilirubin Urine NEGATIVE NEGATIVE   Ketones, ur NEGATIVE NEGATIVE mg/dL   Protein, ur NEGATIVE NEGATIVE mg/dL   Nitrite NEGATIVE NEGATIVE   Leukocytes, UA LARGE (A) NEGATIVE   RBC / HPF 0-5 0 - 5 RBC/hpf   WBC, UA >50 (H) 0 - 5 WBC/hpf   Bacteria, UA RARE (A) NONE SEEN   Squamous Epithelial / LPF 21-50 0 - 5  Pregnancy, urine POC     Status: Abnormal   Collection Time:  09/07/18  4:32 PM  Result Value Ref Range   Preg Test, Ur POSITIVE (A) NEGATIVE  Wet prep, genital     Status: Abnormal   Collection Time: 09/07/18  5:03 PM  Result Value Ref Range   Yeast Wet Prep HPF POC NONE SEEN NONE SEEN   Trich, Wet Prep NONE SEEN NONE SEEN   Clue Cells Wet Prep HPF  POC PRESENT (A) NONE SEEN   WBC, Wet Prep HPF POC FEW (A) NONE SEEN   Sperm NONE SEEN     IMAGING No results found.  MAU COURSE Orders Placed This Encounter  Procedures  . Wet prep, genital  . Urinalysis, Routine w reflex microscopic  . Lab instructions  . Apply cold compresses  . Pregnancy, urine POC  . Discharge patient   Meds ordered this encounter  Medications  . ibuprofen (ADVIL,MOTRIN) tablet 600 mg  . Prenat w/o A Vit-FeFum-FePo-FA (CONCEPT OB) 130-92.4-1 MG CAPS    Sig: Take 1 tablet by mouth daily.    Dispense:  30 capsule    Refill:  12    Order Specific Question:   Supervising Provider    Answer:   Willodean Rosenthal [id number]  . ibuprofen (ADVIL,MOTRIN) 600 MG tablet    Sig: Take 1 tablet (600 mg total) by mouth every 6 (six) hours as needed for moderate pain. Use after [redacted] weeks gestation.  Use sparingly.    Dispense:  30 tablet    Refill:  1    Order Specific Question:   Supervising Provider    Answer:   Willodean Rosenthal [id number]  . metroNIDAZOLE (FLAGYL) 500 MG tablet    Sig: Take 1 tablet (500 mg total) by mouth 2 (two) times daily.    Dispense:  14 tablet    Refill:  0    Order Specific Question:   Supervising Provider    Answer:   Willodean Rosenthal [phone]    MDM -Low abdominal and low back pain likely caused by a combination of round ligament pain exacerbated by physically strenuous day at work and bacterial vaginosis.  Pain improved with ibuprofen and warm compresses.  May use ibuprofen sparingly in the second trimester.  Will treat bacterial vaginosis with Flagyl.  Maternity support belt as needed.  ASSESSMENT 1. BV (bacterial vaginosis)   2. Back  pain affecting pregnancy in second trimester   3. Abdominal pain, LLQ   4. Round ligament pain     PLAN Discharge home in stable condition. Second trimester precautions GC/chlamydia cultures pending. Follow-up Information    [facility] Follow up.   Why:  Start prenatal care Contact information: [address] [place] [address] [phone]       [facility] ADMISS[facility] Follow up.   Why:  As needed in pregnancy emergencies Contact information: [address] [id number] mc [place] [address] [phone]         Allergies as of 09/07/2018   No Known Allergies     Medication List    STOP taking these medications   LO LOESTRIN FE 1 MG-10 MCG / 10 MCG tablet Generic drug:  Norethindrone-Ethinyl Estradiol-Fe Biphas     TAKE these medications   CONCEPT OB 130-92.4-1 MG Caps Take 1 tablet by mouth daily.   ibuprofen 600 MG tablet Commonly known as:  ADVIL,MOTRIN Take 1 tablet (600 mg total) by mouth every 6 (six) hours as needed for moderate pain. Use after [redacted] weeks gestation.  Use sparingly.   metroNIDAZOLE 500 MG tablet Commonly known as:  FLAGYL Take 1 tablet (500 mg total) by mouth 2 (two) times daily.        Katrinka Blazing, [place] 09/07/2018  6:34 PM

## 2018-09-07 NOTE — Discharge Instructions (Signed)
Bacterial Vaginosis Bacterial vaginosis is a vaginal infection that occurs when the normal balance of bacteria in the vagina is disrupted. It results from an overgrowth of certain bacteria. This is the most common vaginal infection among women ages 7-44. Because bacterial vaginosis increases your risk for STIs (sexually transmitted infections), getting treated can help reduce your risk for chlamydia, gonorrhea, herpes, and HIV (human immunodeficiency virus). Treatment is also important for preventing complications in pregnant women, because this condition can cause an early (premature) delivery. What are the causes? This condition is caused by an increase in harmful bacteria that are normally present in small amounts in the vagina. However, the reason that the condition develops is not fully understood. What increases the risk? The following factors may make you more likely to develop this condition:  Having a new sexual partner or multiple sexual partners.  Having unprotected sex.  Douching.  Having an intrauterine device (IUD).  Smoking.  Drug and alcohol abuse.  Taking certain antibiotic medicines.  Being pregnant.  You cannot get bacterial vaginosis from toilet seats, bedding, swimming pools, or contact with objects around you. What are the signs or symptoms? Symptoms of this condition include:  Grey or white vaginal discharge. The discharge can also be watery or foamy.  A fish-like odor with discharge, especially after sexual intercourse or during menstruation.  Itching in and around the vagina.  Burning or pain with urination.  Some women with bacterial vaginosis have no signs or symptoms. How is this diagnosed? This condition is diagnosed based on:  Your medical history.  A physical exam of the vagina.  Testing a sample of vaginal fluid under a microscope to look for a large amount of bad bacteria or abnormal cells. Your health care provider may use a cotton swab  or a small wooden spatula to collect the sample.  How is this treated? This condition is treated with antibiotics. These may be given as a pill, a vaginal cream, or a medicine that is put into the vagina (suppository). If the condition comes back after treatment, a second round of antibiotics may be needed. Follow these instructions at home: Medicines  Take over-the-counter and prescription medicines only as told by your health care provider.  Take or use your antibiotic as told by your health care provider. Do not stop taking or using the antibiotic even if you start to feel better. General instructions  If you have a female sexual partner, tell her that you have a vaginal infection. She should see her health care provider and be treated if she has symptoms. If you have a female sexual partner, he does not need treatment.  During treatment: ? Avoid sexual activity until you finish treatment. ? Do not douche. ? Avoid alcohol as directed by your health care provider. ? Avoid breastfeeding as directed by your health care provider.  Drink enough water and fluids to keep your urine clear or pale yellow.  Keep the area around your vagina and rectum clean. ? Wash the area daily with warm water. ? Wipe yourself from front to back after using the toilet.  Keep all follow-up visits as told by your health care provider. This is important. How is this prevented?  Do not douche.  Wash the outside of your vagina with warm water only.  Use protection when having sex. This includes latex condoms and dental dams.  Limit how many sexual partners you have. To help prevent bacterial vaginosis, it is best to have sex with just  one partner (monogamous). °· Make sure you and your sexual partner are tested for STIs. °· Wear cotton or cotton-lined underwear. °· Avoid wearing tight pants and pantyhose, especially during summer. °· Limit the amount of alcohol that you drink. °· Do not use any products that  contain nicotine or tobacco, such as cigarettes and e-cigarettes. If you need help quitting, ask your health care provider. °· Do not use illegal drugs. °Where to find more information: °· Centers for Disease Control and Prevention: www.cdc.gov/std °· American Sexual Health Association (ASHA): www.ashastd.org °· U.S. Department of Health and Human Services, Office on Women's Health: www.womenshealth.gov/ or https://www.womenshealth.gov/a-z-topics/bacterial-vaginosis °Contact a health care provider if: °· Your symptoms do not improve, even after treatment. °· You have more discharge or pain when urinating. °· You have a fever. °· You have pain in your abdomen. °· You have pain during sex. °· You have vaginal bleeding between periods. °Summary °· Bacterial vaginosis is a vaginal infection that occurs when the normal balance of bacteria in the vagina is disrupted. °· Because bacterial vaginosis increases your risk for STIs (sexually transmitted infections), getting treated can help reduce your risk for chlamydia, gonorrhea, herpes, and HIV (human immunodeficiency virus). Treatment is also important for preventing complications in pregnant women, because the condition can cause an early (premature) delivery. °· This condition is treated with antibiotic medicines. These may be given as a pill, a vaginal cream, or a medicine that is put into the vagina (suppository). °This information is not intended to replace advice given to you by your health care provider. Make sure you discuss any questions you have with your health care provider. °Document Released: 11/19/2005 Document Revised: 03/25/2017 Document Reviewed: 08/04/2016 °Elsevier Interactive Patient Education © 2018 [employer] °Abdominal Pain During Pregnancy °Abdominal pain is common in pregnancy. Most of the time, it does not cause harm. There are many causes of abdominal pain. Some causes are more serious than others and sometimes the cause is not known.  Abdominal pain can be a sign that something is very wrong with the pregnancy or the pain may have nothing to do with the pregnancy. Always tell your health care provider if you have any abdominal pain. °Follow these instructions at home: °· Do not have sex or put anything in your vagina until your symptoms go away completely. °· Watch your abdominal pain for any changes. °· Get plenty of rest until your pain improves. °· Drink enough fluid to keep your urine clear or pale yellow. °· Take over-the-counter or prescription medicines only as told by your health care provider. °· Keep all follow-up visits as told by your health care provider. This is important. °Contact a health care provider if: °· You have a fever. °· Your pain gets worse or you have cramping. °· Your pain continues after resting. °Get help right away if: °· You are bleeding, leaking fluid, or passing tissue from the vagina. °· You have vomiting or diarrhea that does not go away. °· You have painful or bloody urination. °· You notice a decrease in your baby's movements. °· You feel very weak or faint. °· You have shortness of breath. °· You develop a severe headache with abdominal pain. °· You have abnormal vaginal discharge with abdominal pain. °This information is not intended to replace advice given to you by your health care provider. Make sure you discuss any questions you have with your health care provider. °Document Released: 11/19/2005 Document Revised: 08/30/2016 Document Reviewed: 06/18/2013 °Elsevier Interactive Patient Education ©   Patient Education  [employer].

## 2018-09-08 LAB — GC/CHLAMYDIA PROBE AMP (~~LOC~~) NOT AT ARMC
Chlamydia: NEGATIVE
Neisseria Gonorrhea: NEGATIVE

## 2018-10-02 ENCOUNTER — Encounter: Payer: Self-pay | Admitting: Obstetrics

## 2018-10-02 ENCOUNTER — Ambulatory Visit (INDEPENDENT_AMBULATORY_CARE_PROVIDER_SITE_OTHER): Payer: Medicaid Other | Admitting: Obstetrics

## 2018-10-02 ENCOUNTER — Other Ambulatory Visit (HOSPITAL_COMMUNITY)
Admission: RE | Admit: 2018-10-02 | Discharge: 2018-10-02 | Disposition: A | Discharge status: Home / Self Care | Payer: Medicaid Other | Source: Ambulatory Visit | Attending: Obstetrics | Admitting: Obstetrics

## 2018-10-02 VITALS — BP 119/77 | HR 79 | Wt 126.7 lb

## 2018-10-02 DIAGNOSIS — Z3A20 20 weeks gestation of pregnancy: Secondary | ICD-10-CM | POA: Diagnosis not present

## 2018-10-02 DIAGNOSIS — Z3482 Encounter for supervision of other normal pregnancy, second trimester: Secondary | ICD-10-CM

## 2018-10-02 DIAGNOSIS — Z3480 Encounter for supervision of other normal pregnancy, unspecified trimester: Secondary | ICD-10-CM | POA: Insufficient documentation

## 2018-10-02 NOTE — Progress Notes (Signed)
Subjective:  Debbie Wood is a 27 y.o. [id number] at [redacted]w[redacted]d being seen today for ongoing prenatal care.  She is currently monitored for the following issues for this high-risk pregnancy and has Heart murmur; Encounter for supervision of other normal pregnancy in first trimester; Gestational hypertension without significant proteinuria during pregnancy in third trimester, antepartum; History of neonatal death; and Supervision of other normal pregnancy, antepartum on their problem list.  Patient reports no complaints.  Contractions: Not present. Vag. Bleeding: None.  Movement: Present. Denies leaking of fluid.   The following portions of the patient's history were reviewed and updated as appropriate: allergies, current medications, past family history, past medical history, past social history, past surgical history and problem list. Problem list updated.  Objective:   Vitals:   10/02/18 1027  BP: 119/77  Pulse: 79  Weight: 126 lb 11.2 oz (57.5 kg)    Fetal Status:     Movement: Present     General:  Alert, oriented and cooperative. Patient is in no acute distress.  Skin: Skin is warm and dry. No rash noted.   Cardiovascular: Normal heart rate noted  Respiratory: Normal respiratory effort, no problems with respiration noted  Abdomen: Soft, gravid, appropriate for gestational age. Pain/Pressure: Absent     Pelvic:  Cervical exam deferred        Extremities: Normal range of motion.  Edema: None  Mental Status: Normal mood and affect. Normal behavior. Normal judgment and thought content.   Urinalysis:      Assessment and Plan:  Pregnancy: [id number] at [redacted]w[redacted]d  1. Supervision of other normal pregnancy, antepartum Rx: - Cervicovaginal ancillary only( [place]) - Culture, OB Urine - Genetic Screening - Hemoglobinopathy evaluation - Cystic Fibrosis Mutation 97 - SMN1 Copy Number Analysis - [place] MFM OB COMP + 14 WK; Future  Preterm labor symptoms and general obstetric precautions  including but not limited to vaginal bleeding, contractions, leaking of fluid and fetal movement were reviewed in detail with the patient. Please refer to After Visit Summary for other counseling recommendations.  Return in about 4 weeks (around 10/30/2018) for ROB.   Brock Bad, MD

## 2018-10-02 NOTE — Progress Notes (Signed)
Patient is in the office for initial ob visit, unplanned pregnancy, FOB is involved, not living together, reports good fetal movement, denies pain.

## 2018-10-03 LAB — CERVICOVAGINAL ANCILLARY ONLY
Bacterial vaginitis: POSITIVE — AB
Candida vaginitis: NEGATIVE
Chlamydia: NEGATIVE
Neisseria Gonorrhea: NEGATIVE
Trichomonas: NEGATIVE

## 2018-10-04 LAB — CULTURE, OB URINE

## 2018-10-04 LAB — URINE CULTURE, OB REFLEX: ORGANISM ID, BACTERIA: NO GROWTH

## 2018-10-06 ENCOUNTER — Other Ambulatory Visit: Payer: Self-pay | Admitting: Obstetrics

## 2018-10-06 DIAGNOSIS — B9689 Other specified bacterial agents as the cause of diseases classified elsewhere: Secondary | ICD-10-CM

## 2018-10-06 DIAGNOSIS — N760 Acute vaginitis: Principal | ICD-10-CM

## 2018-10-06 MED ORDER — METRONIDAZOLE 0.75 % VA GEL
1.0000 | Freq: Two times a day (BID) | VAGINAL | 0 refills | Status: DC
Start: 2018-10-06 — End: 2018-11-03

## 2018-10-07 ENCOUNTER — Ambulatory Visit (HOSPITAL_COMMUNITY)
Admission: RE | Admit: 2018-10-07 | Discharge: 2018-10-07 | Disposition: A | Discharge status: Home / Self Care | Payer: Medicaid Other | Source: Ambulatory Visit | Attending: Obstetrics | Admitting: Obstetrics

## 2018-10-07 DIAGNOSIS — Z3A21 21 weeks gestation of pregnancy: Secondary | ICD-10-CM | POA: Insufficient documentation

## 2018-10-07 DIAGNOSIS — Z3689 Encounter for other specified antenatal screening: Secondary | ICD-10-CM | POA: Insufficient documentation

## 2018-10-07 DIAGNOSIS — Z363 Encounter for antenatal screening for malformations: Secondary | ICD-10-CM | POA: Diagnosis not present

## 2018-10-07 DIAGNOSIS — Z3480 Encounter for supervision of other normal pregnancy, unspecified trimester: Secondary | ICD-10-CM

## 2018-10-09 ENCOUNTER — Encounter: Payer: Self-pay | Admitting: Obstetrics

## 2018-10-11 LAB — OBSTETRIC PANEL, INCLUDING HIV
Antibody Screen: NEGATIVE
BASOS ABS: 0 10*3/uL (ref 0.0–0.2)
Basos: 0 %
EOS (ABSOLUTE): 0.4 10*3/uL (ref 0.0–0.4)
Eos: 3 %
HEP B S AG: NEGATIVE
HIV SCREEN 4TH GENERATION: NONREACTIVE
Hematocrit: 32.1 % — ABNORMAL LOW (ref 34.0–46.6)
Hemoglobin: 10.7 g/dL — ABNORMAL LOW (ref 11.1–15.9)
IMMATURE GRANULOCYTES: 0 %
Immature Grans (Abs): 0 10*3/uL (ref 0.0–0.1)
LYMPHS ABS: 1.5 10*3/uL (ref 0.7–3.1)
Lymphs: 14 %
MCH: 29.2 pg (ref 26.6–33.0)
MCHC: 33.3 g/dL (ref 31.5–35.7)
MCV: 88 fL (ref 79–97)
MONOCYTES: 4 %
Monocytes Absolute: 0.4 10*3/uL (ref 0.1–0.9)
NEUTROS PCT: 79 %
Neutrophils Absolute: 8.1 10*3/uL — ABNORMAL HIGH (ref 1.4–7.0)
PLATELETS: 290 10*3/uL (ref 150–450)
RBC: 3.66 x10E6/uL — ABNORMAL LOW (ref 3.77–5.28)
RDW: 12.5 % (ref 12.3–15.4)
RPR: NONREACTIVE
Rh Factor: POSITIVE
Rubella Antibodies, IGG: 3.88 index (ref >0.99)
WBC: 10.4 10*3/uL (ref 3.4–10.8)

## 2018-10-11 LAB — SMN1 COPY NUMBER ANALYSIS (SMA CARRIER SCREENING)

## 2018-10-11 LAB — HEMOGLOBINOPATHY EVALUATION
HEMOGLOBIN F QUANTITATION: 0 % (ref 0.0–2.0)
HGB A: 61.3 % — AB (ref 96.4–98.8)
HGB C: 0 %
HGB S: 35.4 % — ABNORMAL HIGH
HGB VARIANT: 0 %
Hemoglobin A2 Quantitation: 3.3 % — ABNORMAL HIGH (ref 1.8–3.2)

## 2018-10-11 LAB — CYSTIC FIBROSIS MUTATION 97: Interpretation: NOT DETECTED

## 2018-10-12 ENCOUNTER — Other Ambulatory Visit: Payer: Self-pay | Admitting: Obstetrics

## 2018-10-12 DIAGNOSIS — D508 Other iron deficiency anemias: Secondary | ICD-10-CM

## 2018-10-12 MED ORDER — FERROUS SULFATE 325 (65 FE) MG PO TABS: 325.0000 mg | ORAL_TABLET | Freq: Two times a day (BID) | ORAL | 5 refills | Status: DC

## 2018-10-16 ENCOUNTER — Other Ambulatory Visit: Payer: Self-pay

## 2018-10-16 MED ORDER — TERCONAZOLE 0.8 % VA CREA: 1.0000 | TOPICAL_CREAM | Freq: Every day | VAGINAL | 0 refills | Status: DC

## 2018-10-16 NOTE — Progress Notes (Signed)
Pt c/o yeast infection after completing ABx. Terazol sent to the pharmacy per protocol.

## 2018-11-03 ENCOUNTER — Ambulatory Visit (INDEPENDENT_AMBULATORY_CARE_PROVIDER_SITE_OTHER): Payer: Medicaid Other | Admitting: Obstetrics

## 2018-11-03 ENCOUNTER — Encounter: Payer: Self-pay | Admitting: Obstetrics

## 2018-11-03 VITALS — BP 123/77 | HR 82 | Wt 128.8 lb

## 2018-11-03 DIAGNOSIS — Z3482 Encounter for supervision of other normal pregnancy, second trimester: Secondary | ICD-10-CM

## 2018-11-03 DIAGNOSIS — Z8759 Personal history of other complications of pregnancy, childbirth and the puerperium: Secondary | ICD-10-CM

## 2018-11-03 DIAGNOSIS — Z3480 Encounter for supervision of other normal pregnancy, unspecified trimester: Secondary | ICD-10-CM

## 2018-11-03 NOTE — Progress Notes (Signed)
Subjective:  Debbie Wood is a 27 y.o. [id number] at 9629w6d being seen today for ongoing prenatal care.  She is currently monitored for the following issues for this low-risk pregnancy and has Heart murmur; Encounter for supervision of other normal pregnancy in first trimester; Gestational hypertension without significant proteinuria during pregnancy in third trimester, antepartum; History of neonatal death; and Supervision of other normal pregnancy, antepartum on their problem list.  Patient reports no complaints.  Contractions: Not present. Vag. Bleeding: None.  Movement: Present. Denies leaking of fluid.   The following portions of the patient's history were reviewed and updated as appropriate: allergies, current medications, past family history, past medical history, past social history, past surgical history and problem list. Problem list updated.  Objective:   Vitals:   11/03/18 0927  BP: 123/77  Pulse: 82  Weight: 128 lb 12.8 oz (58.4 kg)    Fetal Status: Fetal Heart Rate (bpm): 150   Movement: Present     General:  Alert, oriented and cooperative. Patient is in no acute distress.  Skin: Skin is warm and dry. No rash noted.   Cardiovascular: Normal heart rate noted  Respiratory: Normal respiratory effort, no problems with respiration noted  Abdomen: Soft, gravid, appropriate for gestational age. Pain/Pressure: Absent     Pelvic:  Cervical exam deferred        Extremities: Normal range of motion.  Edema: None  Mental Status: Normal mood and affect. Normal behavior. Normal judgment and thought content.   Urinalysis:      Assessment and Plan:  Pregnancy: [id number] at 6529w6d  1. Supervision of other normal pregnancy, antepartum  2. History of neonatal death   Preterm labor symptoms and general obstetric precautions including but not limited to vaginal bleeding, contractions, leaking of fluid and fetal movement were reviewed in detail with the patient. Please refer to After  Visit Summary for other counseling recommendations.  Return in about 4 weeks (around 12/01/2018) for ROB, 2 hour OGTT.   Brock BadHarper, Denea Cheaney A, MD

## 2018-11-03 NOTE — Progress Notes (Signed)
Pt presents for ROB.Pt has no concerns. 

## 2018-12-01 ENCOUNTER — Encounter: Payer: Self-pay | Admitting: Obstetrics

## 2018-12-01 ENCOUNTER — Other Ambulatory Visit: Payer: Medicaid Other

## 2018-12-03 NOTE — L&D Delivery Note (Signed)
Delivery Note On the afternoon of 01/30/19 pt began an IOL for pre-eclampsia without severe features (urine P/C ratio 0.43). She had a single dose of cytotec, a cervical foley, Pit, then AROM as induction measures. She progressed to complete shortly after 0022 on 01/31/19 and pushed with 2 ctx; at 12:36 AM a viable female was delivered via Vaginal, Spontaneous (Presentation: ROA).  APGAR: 8, 9; weight: pending.   Placenta status: spont , intact.  Cord: 3 vessel  Anesthesia:  Epidural Episiotomy:  None Lacerations:  None Est. Blood Loss (mL):  200  Mom to postpartum.  Baby to Couplet care / Skin to Skin.  Arabella Merles CNM 01/31/2019, 12:55 AM  Please schedule this patient for Postpartum visit in: 1 week with the following provider: RN, then 4wk PP visit For C/S patients schedule nurse incision check in weeks 2 weeks: no High risk pregnancy complicated by: pre-eclampsia Delivery mode:  SVD Anticipated Birth Control:  Depo PP Procedures needed: BP check (1wk) Schedule Integrated BH visit: no

## 2018-12-04 ENCOUNTER — Telehealth: Payer: Self-pay | Admitting: Obstetrics

## 2018-12-11 ENCOUNTER — Encounter: Payer: Self-pay | Admitting: Obstetrics

## 2018-12-29 ENCOUNTER — Encounter: Payer: Self-pay | Admitting: Obstetrics

## 2018-12-29 ENCOUNTER — Other Ambulatory Visit: Payer: Medicaid Other

## 2019-01-02 ENCOUNTER — Other Ambulatory Visit: Payer: Medicaid Other

## 2019-01-02 DIAGNOSIS — Z3480 Encounter for supervision of other normal pregnancy, unspecified trimester: Secondary | ICD-10-CM

## 2019-01-03 ENCOUNTER — Other Ambulatory Visit: Payer: Self-pay | Admitting: Obstetrics

## 2019-01-03 LAB — GLUCOSE TOLERANCE, 2 HOURS W/ 1HR
Glucose, 1 hour: 81 mg/dL (ref 65–179)
Glucose, 2 hour: 77 mg/dL (ref 65–152)
Glucose, Fasting: 90 mg/dL (ref 65–91)

## 2019-01-03 LAB — CBC
Hematocrit: 28 % — ABNORMAL LOW (ref 34.0–46.6)
Hemoglobin: 9.2 g/dL — ABNORMAL LOW (ref 11.1–15.9)
MCH: 26.3 pg — ABNORMAL LOW (ref 26.6–33.0)
MCHC: 32.9 g/dL (ref 31.5–35.7)
MCV: 80 fL (ref 79–97)
Platelets: 264 10*3/uL (ref 150–450)
RBC: 3.5 x10E6/uL — ABNORMAL LOW (ref 3.77–5.28)
RDW: 13.8 % (ref 11.7–15.4)
WBC: 8.5 10*3/uL (ref 3.4–10.8)

## 2019-01-03 LAB — HIV ANTIBODY (ROUTINE TESTING W REFLEX): HIV Screen 4th Generation wRfx: NONREACTIVE

## 2019-01-03 LAB — RPR: RPR Ser Ql: NONREACTIVE

## 2019-01-05 ENCOUNTER — Other Ambulatory Visit: Payer: Self-pay | Admitting: Obstetrics

## 2019-01-05 ENCOUNTER — Encounter: Payer: Self-pay | Admitting: Obstetrics

## 2019-01-05 ENCOUNTER — Other Ambulatory Visit: Payer: Self-pay

## 2019-01-05 ENCOUNTER — Ambulatory Visit (INDEPENDENT_AMBULATORY_CARE_PROVIDER_SITE_OTHER): Payer: Medicaid Other | Admitting: Obstetrics

## 2019-01-05 VITALS — BP 119/71 | HR 85 | Wt 139.9 lb

## 2019-01-05 DIAGNOSIS — Z3480 Encounter for supervision of other normal pregnancy, unspecified trimester: Secondary | ICD-10-CM

## 2019-01-05 DIAGNOSIS — D508 Other iron deficiency anemias: Secondary | ICD-10-CM

## 2019-01-05 DIAGNOSIS — Z3483 Encounter for supervision of other normal pregnancy, third trimester: Secondary | ICD-10-CM

## 2019-01-05 DIAGNOSIS — Z8759 Personal history of other complications of pregnancy, childbirth and the puerperium: Secondary | ICD-10-CM

## 2019-01-05 MED ORDER — FERROUS SULFATE 325 (65 FE) MG PO TABS: 325.0000 mg | ORAL_TABLET | Freq: Two times a day (BID) | ORAL | 5 refills | Status: DC

## 2019-01-05 NOTE — Progress Notes (Signed)
Subjective:  Debbie Wood is a 28 y.o. M2L0786 at [redacted]w[redacted]d being seen today for ongoing prenatal care.  She is currently monitored for the following issues for this high-risk pregnancy and has Heart murmur; Encounter for supervision of other normal pregnancy in first trimester; Gestational hypertension without significant proteinuria during pregnancy in third trimester, antepartum; History of neonatal death; and Supervision of other normal pregnancy, antepartum on their problem list.  Patient reports no complaints.  Contractions: Not present. Vag. Bleeding: None.  Movement: Present. Denies leaking of fluid.   The following portions of the patient's history were reviewed and updated as appropriate: allergies, current medications, past family history, past medical history, past social history, past surgical history and problem list. Problem list updated.  Objective:   Vitals:   01/05/19 1106  BP: 119/71  Pulse: 85  Weight: 139 lb 14.4 oz (63.5 kg)    Fetal Status: Fetal Heart Rate (bpm): 140   Movement: Present     General:  Alert, oriented and cooperative. Patient is in no acute distress.  Skin: Skin is warm and dry. No rash noted.   Cardiovascular: Normal heart rate noted  Respiratory: Normal respiratory effort, no problems with respiration noted  Abdomen: Soft, gravid, appropriate for gestational age. Pain/Pressure: Absent     Pelvic:  Cervical exam deferred        Extremities: Normal range of motion.  Edema: None  Mental Status: Normal mood and affect. Normal behavior. Normal judgment and thought content.   Urinalysis:      Assessment and Plan:  Pregnancy: [id number] at [redacted]w[redacted]d  1. Supervision of other normal pregnancy, antepartum  2. History of neonatal death   Preterm labor symptoms and general obstetric precautions including but not limited to vaginal bleeding, contractions, leaking of fluid and fetal movement were reviewed in detail with the patient. Please refer to After  Visit Summary for other counseling recommendations.  Return in about 2 weeks (around 01/19/2019) for ROB.   Brock Bad, MD

## 2019-01-19 ENCOUNTER — Ambulatory Visit (INDEPENDENT_AMBULATORY_CARE_PROVIDER_SITE_OTHER): Payer: Medicaid Other | Admitting: Obstetrics

## 2019-01-19 ENCOUNTER — Encounter: Payer: Self-pay | Admitting: Obstetrics

## 2019-01-19 VITALS — BP 131/88 | HR 98 | Wt 141.5 lb

## 2019-01-19 DIAGNOSIS — Z3480 Encounter for supervision of other normal pregnancy, unspecified trimester: Secondary | ICD-10-CM

## 2019-01-19 DIAGNOSIS — Z3A35 35 weeks gestation of pregnancy: Secondary | ICD-10-CM

## 2019-01-19 DIAGNOSIS — Z3483 Encounter for supervision of other normal pregnancy, third trimester: Secondary | ICD-10-CM

## 2019-01-19 DIAGNOSIS — Z8759 Personal history of other complications of pregnancy, childbirth and the puerperium: Secondary | ICD-10-CM

## 2019-01-19 NOTE — Progress Notes (Signed)
Subjective:  Debbie Wood is a 28 y.o. [id number] at [redacted]w[redacted]d being seen today for ongoing prenatal care.  She is currently monitored for the following issues for this low-risk pregnancy and has Heart murmur; Encounter for supervision of other normal pregnancy in first trimester; Gestational hypertension without significant proteinuria during pregnancy in third trimester, antepartum; History of neonatal death; and Supervision of other normal pregnancy, antepartum on their problem list.  Patient reports no complaints.  Contractions: Not present. Vag. Bleeding: None.  Movement: Present. Denies leaking of fluid.   The following portions of the patient's history were reviewed and updated as appropriate: allergies, current medications, past family history, past medical history, past social history, past surgical history and problem list. Problem list updated.  Objective:   Vitals:   01/19/19 1103 01/19/19 1105  BP: 134/88 131/88  Pulse: 85 98  Weight: 141 lb 8 oz (64.2 kg)     Fetal Status:     Movement: Present     General:  Alert, oriented and cooperative. Patient is in no acute distress.  Skin: Skin is warm and dry. No rash noted.   Cardiovascular: Normal heart rate noted  Respiratory: Normal respiratory effort, no problems with respiration noted  Abdomen: Soft, gravid, appropriate for gestational age. Pain/Pressure: Absent     Pelvic:  Cervical exam deferred        Extremities: Normal range of motion.  Edema: None  Mental Status: Normal mood and affect. Normal behavior. Normal judgment and thought content.   Urinalysis:      Assessment and Plan:  Pregnancy: [id number] at [redacted]w[redacted]d  1. Supervision of other normal pregnancy, antepartum  2. History of neonatal death   Term labor symptoms and general obstetric precautions including but not limited to vaginal bleeding, contractions, leaking of fluid and fetal movement were reviewed in detail with the patient. Please refer to After Visit  Summary for other counseling recommendations.  Return in about 1 week (around 01/26/2019) for ROB.   Brock Bad, MD

## 2019-01-22 ENCOUNTER — Other Ambulatory Visit: Payer: Self-pay

## 2019-01-22 ENCOUNTER — Encounter (HOSPITAL_COMMUNITY): Payer: Self-pay

## 2019-01-22 ENCOUNTER — Inpatient Hospital Stay (HOSPITAL_COMMUNITY)
Admission: AD | Admit: 2019-01-22 | Discharge: 2019-01-22 | Disposition: A | Discharge status: Home / Self Care | Payer: Medicaid Other | Attending: Family Medicine | Admitting: Family Medicine

## 2019-01-22 DIAGNOSIS — Z3A36 36 weeks gestation of pregnancy: Secondary | ICD-10-CM | POA: Insufficient documentation

## 2019-01-22 DIAGNOSIS — O4703 False labor before 37 completed weeks of gestation, third trimester: Secondary | ICD-10-CM | POA: Insufficient documentation

## 2019-01-22 DIAGNOSIS — O479 False labor, unspecified: Secondary | ICD-10-CM

## 2019-01-22 NOTE — Progress Notes (Signed)
Denies HA or dizziness. Floaters resolved.   Adah Perl RN

## 2019-01-22 NOTE — Discharge Instructions (Signed)

## 2019-01-22 NOTE — MAU Note (Signed)
Presents with irregular cxts; starting around 6p today. Pain 6/10. Pos FM. No vag bldg or watery leakage.    Adah Perl RN

## 2019-01-27 ENCOUNTER — Other Ambulatory Visit (HOSPITAL_COMMUNITY)
Admission: RE | Admit: 2019-01-27 | Discharge: 2019-01-27 | Disposition: A | Discharge status: Home / Self Care | Payer: Medicaid Other | Source: Ambulatory Visit | Attending: Obstetrics | Admitting: Obstetrics

## 2019-01-27 ENCOUNTER — Ambulatory Visit (INDEPENDENT_AMBULATORY_CARE_PROVIDER_SITE_OTHER): Payer: Medicaid Other | Admitting: Obstetrics

## 2019-01-27 ENCOUNTER — Encounter: Payer: Self-pay | Admitting: Obstetrics

## 2019-01-27 VITALS — BP 130/80 | HR 85 | Wt 142.3 lb

## 2019-01-27 DIAGNOSIS — Z8759 Personal history of other complications of pregnancy, childbirth and the puerperium: Secondary | ICD-10-CM

## 2019-01-27 DIAGNOSIS — Z3A37 37 weeks gestation of pregnancy: Secondary | ICD-10-CM | POA: Diagnosis not present

## 2019-01-27 DIAGNOSIS — Z3480 Encounter for supervision of other normal pregnancy, unspecified trimester: Secondary | ICD-10-CM

## 2019-01-27 DIAGNOSIS — Z3483 Encounter for supervision of other normal pregnancy, third trimester: Secondary | ICD-10-CM

## 2019-01-27 NOTE — Progress Notes (Signed)
Subjective:  Debbie Wood is a 28 y.o. [id number] at [redacted]w[redacted]d being seen today for ongoing prenatal care.  She is currently monitored for the following issues for this low-risk pregnancy and has Heart murmur; Encounter for supervision of other normal pregnancy in first trimester; Gestational hypertension without significant proteinuria during pregnancy in third trimester, antepartum; History of neonatal death; and Supervision of other normal pregnancy, antepartum on their problem list.  Patient reports no complaints.  Contractions: Not present. Vag. Bleeding: None.  Movement: Present. Denies leaking of fluid.   The following portions of the patient's history were reviewed and updated as appropriate: allergies, current medications, past family history, past medical history, past social history, past surgical history and problem list. Problem list updated.  Objective:   Vitals:   01/27/19 0946  BP: 130/80  Pulse: 85  Weight: 142 lb 4.8 oz (64.5 kg)    Fetal Status:     Movement: Present     General:  Alert, oriented and cooperative. Patient is in no acute distress.  Skin: Skin is warm and dry. No rash noted.   Cardiovascular: Normal heart rate noted  Respiratory: Normal respiratory effort, no problems with respiration noted  Abdomen: Soft, gravid, appropriate for gestational age. Pain/Pressure: Absent     Pelvic:  Cervical exam deferred        Extremities: Normal range of motion.  Edema: None  Mental Status: Normal mood and affect. Normal behavior. Normal judgment and thought content.   Urinalysis:      Assessment and Plan:  Pregnancy: H6Y6168 at [redacted]w[redacted]d  1. Supervision of other normal pregnancy, antepartum Rx: - Cervicovaginal ancillary only( ) - Strep Gp B NAA  2. History of neonatal death   Term labor symptoms and general obstetric precautions including but not limited to vaginal bleeding, contractions, leaking of fluid and fetal movement were reviewed in detail  with the patient. Please refer to After Visit Summary for other counseling recommendations.  Return in about 1 week (around 02/03/2019) for ROB.   Brock Bad, MD

## 2019-01-27 NOTE — Progress Notes (Signed)
Patient reports fetal movement, denies pain. 

## 2019-01-29 ENCOUNTER — Other Ambulatory Visit: Payer: Self-pay | Admitting: Obstetrics

## 2019-01-29 DIAGNOSIS — N760 Acute vaginitis: Principal | ICD-10-CM

## 2019-01-29 DIAGNOSIS — B3731 Acute candidiasis of vulva and vagina: Secondary | ICD-10-CM

## 2019-01-29 DIAGNOSIS — B373 Candidiasis of vulva and vagina: Secondary | ICD-10-CM

## 2019-01-29 DIAGNOSIS — B9689 Other specified bacterial agents as the cause of diseases classified elsewhere: Secondary | ICD-10-CM

## 2019-01-29 LAB — CERVICOVAGINAL ANCILLARY ONLY
Bacterial vaginitis: POSITIVE — AB
CANDIDA VAGINITIS: POSITIVE — AB
Chlamydia: NEGATIVE
NEISSERIA GONORRHEA: NEGATIVE
TRICH (WINDOWPATH): NEGATIVE

## 2019-01-29 LAB — STREP GP B NAA: STREP GROUP B AG: NEGATIVE

## 2019-01-29 MED ORDER — TERCONAZOLE 0.4 % VA CREA: 1.0000 | TOPICAL_CREAM | Freq: Every day | VAGINAL | 0 refills | Status: DC

## 2019-01-29 MED ORDER — TINIDAZOLE 500 MG PO TABS: 1000.0000 mg | ORAL_TABLET | Freq: Every day | ORAL | 2 refills | Status: DC

## 2019-01-30 ENCOUNTER — Inpatient Hospital Stay (HOSPITAL_COMMUNITY): Payer: Medicaid Other | Admitting: Anesthesiology

## 2019-01-30 ENCOUNTER — Inpatient Hospital Stay (HOSPITAL_COMMUNITY)
Admission: AD | Admit: 2019-01-30 | Discharge: 2019-02-02 | DRG: 806 | Disposition: A | Discharge status: Home / Self Care | Payer: Medicaid Other | Attending: Obstetrics and Gynecology | Admitting: Obstetrics and Gynecology

## 2019-01-30 ENCOUNTER — Encounter (HOSPITAL_COMMUNITY): Payer: Self-pay | Admitting: *Deleted

## 2019-01-30 DIAGNOSIS — O9902 Anemia complicating childbirth: Secondary | ICD-10-CM | POA: Diagnosis present

## 2019-01-30 DIAGNOSIS — O99214 Obesity complicating childbirth: Secondary | ICD-10-CM | POA: Diagnosis present

## 2019-01-30 DIAGNOSIS — O9882 Other maternal infectious and parasitic diseases complicating childbirth: Secondary | ICD-10-CM | POA: Diagnosis present

## 2019-01-30 DIAGNOSIS — D573 Sickle-cell trait: Secondary | ICD-10-CM | POA: Diagnosis present

## 2019-01-30 DIAGNOSIS — O1404 Mild to moderate pre-eclampsia, complicating childbirth: Principal | ICD-10-CM | POA: Diagnosis present

## 2019-01-30 DIAGNOSIS — B373 Candidiasis of vulva and vagina: Secondary | ICD-10-CM | POA: Diagnosis present

## 2019-01-30 DIAGNOSIS — Z87891 Personal history of nicotine dependence: Secondary | ICD-10-CM

## 2019-01-30 DIAGNOSIS — Z3A37 37 weeks gestation of pregnancy: Secondary | ICD-10-CM | POA: Diagnosis not present

## 2019-01-30 DIAGNOSIS — O1490 Unspecified pre-eclampsia, unspecified trimester: Secondary | ICD-10-CM | POA: Diagnosis present

## 2019-01-30 DIAGNOSIS — R011 Cardiac murmur, unspecified: Secondary | ICD-10-CM | POA: Diagnosis present

## 2019-01-30 DIAGNOSIS — Z8759 Personal history of other complications of pregnancy, childbirth and the puerperium: Secondary | ICD-10-CM

## 2019-01-30 DIAGNOSIS — Z3480 Encounter for supervision of other normal pregnancy, unspecified trimester: Secondary | ICD-10-CM

## 2019-01-30 DIAGNOSIS — O1494 Unspecified pre-eclampsia, complicating childbirth: Secondary | ICD-10-CM | POA: Diagnosis present

## 2019-01-30 HISTORY — DX: Gestational (pregnancy-induced) hypertension without significant proteinuria, third trimester: O13.3

## 2019-01-30 HISTORY — DX: Gestational (pregnancy-induced) hypertension without significant proteinuria, unspecified trimester: O13.9

## 2019-01-30 LAB — PROTEIN / CREATININE RATIO, URINE
CREATININE, URINE: 60.86 mg/dL
PROTEIN CREATININE RATIO: 0.43 mg/mg{creat} — AB (ref 0.00–0.15)
Total Protein, Urine: 26 mg/dL

## 2019-01-30 LAB — COMPREHENSIVE METABOLIC PANEL
ALBUMIN: 2.9 g/dL — AB (ref 3.5–5.0)
ALT: 8 U/L (ref 0–44)
ANION GAP: 10 (ref 5–15)
AST: 20 U/L (ref 15–41)
Alkaline Phosphatase: 212 U/L — ABNORMAL HIGH (ref 38–126)
BILIRUBIN TOTAL: 1.1 mg/dL (ref 0.3–1.2)
BUN: <5 mg/dL — ABNORMAL LOW (ref 6–20)
CO2: 18 mmol/L — ABNORMAL LOW (ref 22–32)
Calcium: 9.2 mg/dL (ref 8.9–10.3)
Chloride: 107 mmol/L (ref 98–111)
Creatinine, Ser: 0.52 mg/dL (ref 0.44–1.00)
GFR calc Af Amer: >60 mL/min (ref >60)
GFR calc non Af Amer: >60 mL/min (ref >60)
GLUCOSE: 76 mg/dL (ref 70–99)
POTASSIUM: 3.7 mmol/L (ref 3.5–5.1)
Sodium: 135 mmol/L (ref 135–145)
TOTAL PROTEIN: 6.3 g/dL — AB (ref 6.5–8.1)

## 2019-01-30 LAB — CBC
HEMATOCRIT: 29.1 % — AB (ref 36.0–46.0)
HEMATOCRIT: 27.3 % — AB (ref 36.0–46.0)
Hemoglobin: 9 g/dL — ABNORMAL LOW (ref 12.0–15.0)
Hemoglobin: 8.4 g/dL — ABNORMAL LOW (ref 12.0–15.0)
MCH: 23.6 pg — ABNORMAL LOW (ref 26.0–34.0)
MCH: 23.3 pg — ABNORMAL LOW (ref 26.0–34.0)
MCHC: 30.9 g/dL (ref 30.0–36.0)
MCHC: 30.8 g/dL (ref 30.0–36.0)
MCV: 76.4 fL — ABNORMAL LOW (ref 80.0–100.0)
MCV: 75.8 fL — ABNORMAL LOW (ref 80.0–100.0)
Platelets: 279 10*3/uL (ref 150–400)
Platelets: 249 10*3/uL (ref 150–400)
RBC: 3.81 MIL/uL — ABNORMAL LOW (ref 3.87–5.11)
RBC: 3.6 MIL/uL — ABNORMAL LOW (ref 3.87–5.11)
RDW: 15.4 % (ref 11.5–15.5)
RDW: 15.4 % (ref 11.5–15.5)
WBC: 8.8 10*3/uL (ref 4.0–10.5)
WBC: 14 10*3/uL — ABNORMAL HIGH (ref 4.0–10.5)
nRBC: 0.3 % — ABNORMAL HIGH (ref 0.0–0.2)
nRBC: 0 % (ref 0.0–0.2)

## 2019-01-30 LAB — TYPE AND SCREEN
ABO/RH(D): A POS
Antibody Screen: NEGATIVE

## 2019-01-30 LAB — ABO/RH: ABO/RH(D): A POS

## 2019-01-30 MED ORDER — OXYTOCIN 40 UNITS IN NORMAL SALINE INFUSION - SIMPLE MED
1.0000 m[IU]/min | INTRAVENOUS | Status: DC
  Administered 2019-01-30: 2 m[IU]/min via INTRAVENOUS
  Filled 2019-01-30: qty 1000

## 2019-01-30 MED ORDER — MISOPROSTOL 50MCG HALF TABLET
50.0000 ug | ORAL_TABLET | ORAL | Status: DC | PRN
  Administered 2019-01-30: 50 ug via BUCCAL
  Filled 2019-01-30 (×2): qty 1

## 2019-01-30 MED ORDER — FENTANYL CITRATE (PF) 100 MCG/2ML IJ SOLN
100.0000 ug | INTRAMUSCULAR | Status: DC | PRN
  Administered 2019-01-30 (×2): 100 ug via INTRAVENOUS
  Filled 2019-01-30 (×2): qty 2

## 2019-01-30 MED ORDER — ONDANSETRON HCL 4 MG/2ML IJ SOLN: 4.0000 mg | Freq: Four times a day (QID) | INTRAMUSCULAR | Status: DC | PRN

## 2019-01-30 MED ORDER — SOD CITRATE-CITRIC ACID 500-334 MG/5ML PO SOLN: 30.0000 mL | ORAL | Status: DC | PRN

## 2019-01-30 MED ORDER — EPHEDRINE 5 MG/ML INJ: 10.0000 mg | INTRAVENOUS | Status: DC | PRN

## 2019-01-30 MED ORDER — TERBUTALINE SULFATE 1 MG/ML IJ SOLN: 0.2500 mg | Freq: Once | INTRAMUSCULAR | Status: DC | PRN

## 2019-01-30 MED ORDER — LACTATED RINGERS IV SOLN
INTRAVENOUS | Status: DC
  Administered 2019-01-30: 22:00:00 via INTRAVENOUS

## 2019-01-30 MED ORDER — OXYTOCIN BOLUS FROM INFUSION
500.0000 mL | Freq: Once | INTRAVENOUS | Status: AC
  Administered 2019-01-31: 500 mL via INTRAVENOUS

## 2019-01-30 MED ORDER — LACTATED RINGERS IV SOLN: 500.0000 mL | INTRAVENOUS | Status: DC | PRN

## 2019-01-30 MED ORDER — PHENYLEPHRINE 40 MCG/ML (10ML) SYRINGE FOR IV PUSH (FOR BLOOD PRESSURE SUPPORT): 80.0000 ug | PREFILLED_SYRINGE | INTRAVENOUS | Status: DC | PRN

## 2019-01-30 MED ORDER — OXYTOCIN 40 UNITS IN NORMAL SALINE INFUSION - SIMPLE MED: 2.5000 [IU]/h | INTRAVENOUS | Status: DC

## 2019-01-30 MED ORDER — LIDOCAINE HCL (PF) 1 % IJ SOLN: 30.0000 mL | INTRAMUSCULAR | Status: DC | PRN

## 2019-01-30 MED ORDER — PHENYLEPHRINE 40 MCG/ML (10ML) SYRINGE FOR IV PUSH (FOR BLOOD PRESSURE SUPPORT)
80.0000 ug | PREFILLED_SYRINGE | INTRAVENOUS | Status: DC | PRN
  Filled 2019-01-30: qty 10

## 2019-01-30 MED ORDER — DIPHENHYDRAMINE HCL 50 MG/ML IJ SOLN: 12.5000 mg | INTRAMUSCULAR | Status: DC | PRN

## 2019-01-30 MED ORDER — LACTATED RINGERS IV SOLN
500.0000 mL | Freq: Once | INTRAVENOUS | Status: AC
  Administered 2019-01-30: 500 mL via INTRAVENOUS

## 2019-01-30 MED ORDER — SODIUM CHLORIDE (PF) 0.9 % IJ SOLN
INTRAMUSCULAR | Status: DC | PRN
  Administered 2019-01-30: 12 mL/h via EPIDURAL

## 2019-01-30 MED ORDER — FENTANYL-BUPIVACAINE-NACL 0.5-0.125-0.9 MG/250ML-% EP SOLN
12.0000 mL/h | EPIDURAL | Status: DC | PRN
  Filled 2019-01-30: qty 250

## 2019-01-30 MED ORDER — METRONIDAZOLE 500 MG PO TABS
500.0000 mg | ORAL_TABLET | Freq: Two times a day (BID) | ORAL | Status: DC
  Administered 2019-01-30 (×2): 500 mg via ORAL
  Filled 2019-01-30 (×2): qty 1

## 2019-01-30 MED ORDER — ACETAMINOPHEN 325 MG PO TABS: 650.0000 mg | ORAL_TABLET | ORAL | Status: DC | PRN

## 2019-01-30 MED ORDER — BUTALBITAL-APAP-CAFFEINE 50-325-40 MG PO TABS
2.0000 | ORAL_TABLET | Freq: Once | ORAL | Status: AC
  Administered 2019-01-30: 2 via ORAL
  Filled 2019-01-30: qty 2

## 2019-01-30 NOTE — MAU Note (Addendum)
Pt started having uc's during the night, also having constant upper abdominal pain since 0200, "feels tight or like I'm being punched."  Denies bleeding or LOF.  Reports good fetal movement.

## 2019-01-30 NOTE — Anesthesia Procedure Notes (Signed)
Epidural Patient location during procedure: OB Start time: 01/30/2019 10:08 PM End time: 01/30/2019 10:20 PM  Staffing Anesthesiologist: Lowella Curb, MD Performed: anesthesiologist   Preanesthetic Checklist Completed: patient identified, site marked, surgical consent, pre-op evaluation, timeout performed, IV checked, risks and benefits discussed and monitors and equipment checked  Epidural Patient position: sitting Prep: ChloraPrep Patient monitoring: heart rate, cardiac monitor, continuous pulse ox and blood pressure Approach: midline Location: L2-L3 Injection technique: LOR saline  Needle:  Needle type: Tuohy  Needle gauge: 17 G Needle length: 9 cm Needle insertion depth: 4 cm Catheter type: closed end flexible Catheter size: 20 Guage Catheter at skin depth: 8 cm Test dose: negative  Assessment Events: blood not aspirated, injection not painful, no injection resistance, negative IV test and no paresthesia  Additional Notes Reason for block:procedure for pain

## 2019-01-30 NOTE — Progress Notes (Signed)
Patient ID: Debbie Wood, female   DOB: 12/04/1990, 28 y.o.   MRN: [id number]  S/p cytotec x 1 and cervical foley; now on Pit and feeling ctx more; getting ready to receive Fentanyl  BPs 130/77, 122/89, 121/71 FHR 135, +accels, no decels, Cat 1 Ctx q 2 mins with Pit at 30mu/min Cx 5+/70/vtx -2; AROM for pink-tinged fluid  IUP@term  Pre-e without severe features Early active labor  Anticipate more active labor to start now that membranes are ruptured; plan to keep ctx regular; would eventually like epidural; anticipate SVD  Debbie Wood [facility] 01/30/2019 9:37 PM

## 2019-01-30 NOTE — Progress Notes (Signed)
LABOR PROGRESS NOTE  Debbie Wood is a 28 y.o. [id number] at [redacted]w[redacted]d  admitted for IOL for preeclampsia without severe features.  Subjective: Patient doing well. A little nervous about the labor process but wanting to get it over with. Discussed augmentation of labor and next options. Patient is open to starting Pitocin. Denies any headache or changes in vision.  Objective: BP 109/79   Pulse 87   Temp 98.2 F (36.8 C) (Oral)   Resp 18   Ht 5\' 3"  (1.6 m)   Wt 64.9 kg   LMP 05/13/2018   SpO2 100%   BMI 25.33 kg/m  or  Vitals:   01/30/19 1511 01/30/19 1613 01/30/19 1732 01/30/19 1753  BP: 128/68 126/69 (!) 141/107 109/79  Pulse: 98 90 94 87  Resp:  16 16 18   Temp:    98.2 F (36.8 C)  TempSrc:    Oral  SpO2:      Weight:      Height:       Dilation: 4.5 Effacement (%): 50 Cervical Position: Middle Station: -2 Presentation: Vertex Exam by:: Earlene Plater, RN  FHT: baseline rate 140 bpm, moderate varibility, + acel, no decel Toco: ctx q1-5 mins  Labs: Lab Results  Component Value Date   WBC 8.8 01/30/2019   HGB 9.0 (L) 01/30/2019   HCT 29.1 (L) 01/30/2019   MCV 76.4 (L) 01/30/2019   PLT 279 01/30/2019    Patient Active Problem List   Diagnosis Date Noted  . Preeclampsia 01/30/2019  . Supervision of other normal pregnancy, antepartum 10-15-18  . History of neonatal death Sep 20, 2018  . Encounter for supervision of other normal pregnancy in first trimester 12/23/2015  . Heart murmur     Assessment / Plan: 28 y.o. [id number] at [redacted]w[redacted]d here for IOL for preeclampsia.  Labor: S/p FB and Cytotec x 1. FB removed. Discussed further augmentation with patient. Will start low dose pitocin 2x2 and titrate per [facility]'s Fetal Wellbeing:  Cat I Pain Control:  IV pain meds/Epidural upon request Anticipated MOD:  NSVD  Orpah Cobb, D.O. [facility], PGY1 01/30/2019, 5:55 PM

## 2019-01-30 NOTE — Anesthesia Preprocedure Evaluation (Signed)
Anesthesia Evaluation  Patient identified by MRN, date of birth, ID band Patient awake    Reviewed: Allergy & Precautions, NPO status , Patient's Chart, lab work & pertinent test results  History of Anesthesia Complications Negative for: history of anesthetic complications  Airway Mallampati: II  TM Distance: >3 FB Neck ROM: Full    Dental no notable dental hx. (+) Dental Advisory Given   Pulmonary former smoker,    Pulmonary exam normal breath sounds clear to auscultation       Cardiovascular Exercise Tolerance: Good hypertension, negative cardio ROS Normal cardiovascular examI+ Valvular Problems/Murmurs  Rhythm:Regular Rate:Normal  Echo 3/17: Echo showed normal LVF with mild TR with very mild pulmonary HTN - repeat echo in 1 year   Neuro/Psych negative neurological ROS  negative psych ROS   GI/Hepatic negative GI ROS, Neg liver ROS,   Endo/Other  obesity  Renal/GU negative Renal ROS  negative genitourinary   Musculoskeletal negative musculoskeletal ROS (+)   Abdominal   Peds negative pediatric ROS (+)  Hematology negative hematology ROS (+)   Anesthesia Other Findings   Reproductive/Obstetrics (+) Pregnancy                             Anesthesia Physical  Anesthesia Plan  ASA: II  Anesthesia Plan: Epidural   Post-op Pain Management:    Induction:   PONV Risk Score and Plan:   Airway Management Planned:   Additional Equipment:   Intra-op Plan:   Post-operative Plan:   Informed Consent: I have reviewed the patients History and Physical, chart, labs and discussed the procedure including the risks, benefits and alternatives for the proposed anesthesia with the patient or authorized representative who has indicated his/her understanding and acceptance.     Dental advisory given  Plan Discussed with: CRNA  Anesthesia Plan Comments:         Anesthesia Quick  Evaluation

## 2019-01-30 NOTE — MAU Provider Note (Signed)
History     CSN: [id number]  Arrival date and time: 01/30/19 1003   First Provider Initiated Contact with Patient 01/30/19 1026      Chief Complaint  Patient presents with  . Contractions   Debbie Wood is a 28 y.o. O5D6644 at [redacted]w[redacted]d who presents for Contractions.  She states the contractions at 0230 and have increasingly worsened.  She states she has been having braxton hicks for weeks, but at around 0630 she started having constant "tightening" at the top of her abdomen.  She endorses a headache that is 3/10 and has not taken any medications.  She also endorses one incident of spotty vision around 0800.  She states that she was seen in the office and was told that her bp was slightly higher than normal, but still within normal range.  Patient endorses good fetal movement and denies VB and leaking of fluid.      OB History    Gravida  5   Para  3   Term  3   Preterm  0   AB  1   Living  2     SAB  0   TAB  1   Ectopic  0   Multiple  0   Live Births  3        Obstetric Comments  Pt induced         Past Medical History:  Diagnosis Date  . Chest pain   . Heart murmur   . Hypertension affecting pregnancy in third trimester    does not have issues with it anymore  . Pregnancy induced hypertension     Past Surgical History:  Procedure Laterality Date  . INDUCED ABORTION      Family History  Problem Relation Age of Onset  . Hypertension Mother   . Hyperlipidemia Mother   . Hypertension Father   . Heart attack Neg Hx     Social History   Tobacco Use  . Smoking status: Former Smoker    Types: Cigarettes    Last attempt to quit: 06/03/2009    Years since quitting: 9.6  . Smokeless tobacco: Never Used  Substance Use Topics  . Alcohol use: No  . Drug use: No    Allergies: No Known Allergies  Medications Prior to Admission  Medication Sig Dispense Refill Last Dose  . ferrous sulfate 325 (65 FE) MG tablet Take 1 tablet (325 mg total)  by mouth 2 (two) times daily with a meal. 60 tablet 5 Taking  . Prenat w/o A Vit-FeFum-FePo-FA (CONCEPT OB) 130-92.4-1 MG CAPS Take 1 tablet by mouth daily. 30 capsule 12 Taking  . terconazole (TERAZOL 7) 0.4 % vaginal cream Place 1 applicator vaginally at bedtime. 45 g 0   . tinidazole (TINDAMAX) 500 MG tablet Take 2 tablets (1,000 mg total) by mouth daily with breakfast. 10 tablet 2     Review of Systems  Constitutional: Negative for chills and fever.  Eyes: Positive for visual disturbance.  Respiratory: Positive for chest tightness (with contractions). Negative for shortness of breath.   Gastrointestinal: Positive for abdominal pain. Negative for constipation, diarrhea, nausea and vomiting. Abdominal distention: Top of abdomen near sternum.  Genitourinary: Negative for vaginal bleeding and vaginal discharge.  Neurological: Positive for headaches. Negative for dizziness and light-headedness.   Physical Exam   Blood pressure 134/87, pulse 81, temperature 97.7 F (36.5 C), temperature source Oral, resp. rate 20, last menstrual period 05/13/2018, SpO2 100 %.  Physical Exam  Constitutional: She is oriented to person, place, and time. She appears well-developed and well-nourished. No distress.  HENT:  Head: Normocephalic and atraumatic.  Eyes: Conjunctivae are normal.  Neck: Normal range of motion.  Cardiovascular: Normal rate, regular rhythm and normal heart sounds.  Respiratory: Effort normal and breath sounds normal.  GI: Soft. Bowel sounds are normal. There is abdominal tenderness (Upper abdomen near sternum).  Genitourinary:    Genitourinary Comments: Deferred by provider  Dilation: 1.5 Effacement (%): Thick Cervical Position: Middle Station: -2 Presentation: Vertex Exam by:: Dorrene German RN    Musculoskeletal: Normal range of motion.  Neurological: She is alert and oriented to person, place, and time.  Skin: Skin is warm and dry.  Psychiatric: She has a normal mood and affect.  Her behavior is normal.    Fetal Assessment 125 bpm, Mod Var, -Decels, +Accels Toco: Q1-42min  MAU Course   Results for orders placed or performed during the hospital encounter of 01/30/19 (from the past 24 hour(s))  CBC     Status: Abnormal   Collection Time: 01/30/19 10:30 AM  Result Value Ref Range   WBC 8.8 4.0 - 10.5 K/uL   RBC 3.81 (L) 3.87 - 5.11 MIL/uL   Hemoglobin 9.0 (L) 12.0 - 15.0 g/dL   HCT 08.1 (L) 44.8 - 18.5 %   MCV 76.4 (L) 80.0 - 100.0 fL   MCH 23.6 (L) 26.0 - 34.0 pg   MCHC 30.9 30.0 - 36.0 g/dL   RDW 63.1 49.7 - 02.6 %   Platelets 279 150 - 400 K/uL   nRBC 0.3 (H) 0.0 - 0.2 %  Protein / creatinine ratio, urine     Status: Abnormal   Collection Time: 01/30/19 10:47 AM  Result Value Ref Range   Creatinine, Urine 60.86 mg/dL   Total Protein, Urine 26 mg/dL   Protein Creatinine Ratio 0.43 (H) 0.00 - 0.15 mg/mg[Cre]    MDM PE Labs: CBC. CMP, PC Ratio Start IV-Lock   Assessment and Plan  28 year old G5P3012 at 37.3 weeks Contractions Elevated BP H/O PreEclampsia  -Exam findings discussed -Will draw PIH labs -Patient declines pain medication for headache currently -Will continue to monitor and await results   Follow Up (12:02 PM) PreEclampsia  Anemia  -Labs return significant for PreE -BP remain stable -Patient reports worsening of headache, now 5/10 -Will give 2 tablets fiorcet now -Discussed need for admission with induction today. -Patient questions what induction method will be and informed that provider unsure.  Instructed to discuss with provider on L&D -No other questions or concerns -Report given to S. Reita Cliche, CNM and resident K. Mauri Reading, MD -Admit to L&D    Cherre Robins MSN, CNM 01/30/2019, 10:26 AM

## 2019-01-30 NOTE — Anesthesia Pain Management Evaluation Note (Signed)
  CRNA Pain Management Visit Note  Patient: Debbie Wood, 28 y.o., female  "Hello I am a member of the anesthesia team at [facility] and [employer]. We have an anesthesia team available at all times to provide care throughout the hospital, including epidural management and anesthesia for C-section. I don't know your plan for the delivery whether it a natural birth, water birth, IV sedation, nitrous supplementation, doula or epidural, but we want to meet your pain goals."   1.Was your pain managed to your expectations on prior hospitalizations?   Yes   2.What is your expectation for pain management during this hospitalization?     epidural  3.How can we help you reach that goal? Rn preparing patient for epidural  Record the patient's initial score and the patient's pain goal.   Pain: Did not ask Pain Goal: 4 [facility] wants you to be able to say your pain was always managed very well.  [name] 01/30/2019

## 2019-01-30 NOTE — H&P (Addendum)
LABOR AND DELIVERY ADMISSION HISTORY AND PHYSICAL NOTE  Debbie Wood is a 28 y.o. female [phone] with IUP at [redacted]w[redacted]d by LMP presenting for IOL for preeclampsia. She reports positive fetal movement. She denies leakage of fluid or vaginal bleeding.  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 Chest pain   . Heart murmur   . Hypertension affecting pregnancy in third trimester    does not have issues with it anymore  . Pregnancy induced hypertension     Past Surgical History: Past Surgical History:  Procedure Laterality Date  . INDUCED ABORTION      Obstetrical History: OB History    Gravida  5   Para  3   Term  3   Preterm  0   AB  1   Living  2     SAB  0   TAB  1   Ectopic  0   Multiple  0   Live Births  3        Obstetric Comments  Pt induced         Social History: Social History   Socioeconomic History  . Marital status: Single    Spouse name: Not on file  . Number of children: Not on file  . Years of education: Not on file  . Highest education level: Not on file  Occupational History  . Not on file  Social Needs  . Financial resource strain: Not on file  . Food insecurity:    Worry: Not on file    Inability: Not on file  . Transportation needs:    Medical: Not on file    Non-medical: Not on file  Tobacco Use  . Smoking status: Former Smoker    Types: Cigarettes    Last attempt to quit: 06/03/2009    Years since quitting: 9.6  . Smokeless tobacco: Never Used  Substance and Sexual Activity  . Alcohol use: No  . Drug use: No  . Sexual activity: Yes    Partners: Male    Birth control/protection: None  Lifestyle  . Physical activity:    Days per week: Not on file    Minutes per session: Not on file  .  Stress: Not on file  Relationships  . Social connections:    Talks on phone: Not on file    Gets together: Not on file    Attends religious service: Not on file    Active member of club or organization: Not on file    Attends meetings of clubs or organizations: Not on file    Relationship status: Not on file  Other Topics Concern  . Not on file  Social History Narrative  . Not on file    Family History: Family History  Problem Relation Age of Onset  . Hypertension Mother   . Hyperlipidemia Mother   . Hypertension Father   . Heart attack Neg Hx     Allergies: No Known Allergies  Medications Prior to Admission  Medication Sig Dispense Refill Last Dose  . ferrous sulfate 325 (65 FE) MG tablet Take 1 tablet (325 mg total) by mouth 2 (two) times daily with a meal. 60 tablet 5  at Unknown time  . Prenat w/o A Vit-FeFum-FePo-FA (CONCEPT OB) 130-92.4-1 MG CAPS Take  1 tablet by mouth daily. 30 capsule 12  at Unknown time  . terconazole (TERAZOL 7) 0.4 % vaginal cream Place 1 applicator vaginally at bedtime. 45 g 0  at Unknown time  . tinidazole (TINDAMAX) 500 MG tablet Take 2 tablets (1,000 mg total) by mouth daily with breakfast. 10 tablet 2  at Unknown time     Review of Systems  All systems reviewed and negative except as stated in HPI  Physical Exam Blood pressure (!) 121/51, pulse 80, temperature 97.7 F (36.5 C), temperature source Oral, resp. rate 20, last menstrual period 05/13/2018, SpO2 100 %. General appearance: alert, oriented, NAD Lungs: normal respiratory effort Heart: regular rate Abdomen: soft, non-tender; gravid, FH appropriate for GA Extremities: No calf swelling or tenderness Presentation: cephalic Fetal monitoring: baseline 140 bpm, moderate variability, + accels, no decels Uterine activity: irregular contractions Dilation: 1.5 Effacement (%): Thick Station: -2 Exam by:: Dorrene German RN  Prenatal labs: ABO, Rh: A/Positive/-- (10/31 1412) Antibody:  Negative (10/31 1412) Rubella: 3.88 (10/31 1412) RPR: Non Reactive (01/31 1110)  HBsAg: Negative (10/31 1412)  HIV: Non Reactive (01/31 1110)  GC/Chlamydia: Negative GBS: Negative (02/25 1406)  2-hr GTT: WNL (90/81/77) Genetic screening:  NIPS: low risk, CF neg, SMN1 copy #: 2 Anatomy US: Normal  Prenatal Transfer Tool  Maternal Diabetes: No Genetic Screening: Normal Maternal Ultrasounds/Referrals: Normal Fetal Ultrasounds or other Referrals:  None Maternal Substance Abuse:  No Significant Maternal Medications:  Meds include: Other: Iron supplement, PNV Significant Maternal Lab Results: None  Results for orders placed or performed during the hospital encounter of 01/30/19 (from the past 24 hour(s))  CBC   Collection Time: 01/30/19 10:30 AM  Result Value Ref Range   WBC 8.8 4.0 - 10.5 K/uL   RBC 3.81 (L) 3.87 - 5.11 MIL/uL   Hemoglobin 9.0 (L) 12.0 - 15.0 g/dL   HCT 76.8 (L) 08.8 - 11.0 %   MCV 76.4 (L) 80.0 - 100.0 fL   MCH 23.6 (L) 26.0 - 34.0 pg   MCHC 30.9 30.0 - 36.0 g/dL   RDW 31.5 94.5 - 85.9 %   Platelets 279 150 - 400 K/uL   nRBC 0.3 (H) 0.0 - 0.2 %  Comprehensive metabolic panel   Collection Time: 01/30/19 10:30 AM  Result Value Ref Range   Sodium 135 135 - 145 mmol/L   Potassium 3.7 3.5 - 5.1 mmol/L   Chloride 107 98 - 111 mmol/L   CO2 18 (L) 22 - 32 mmol/L   Glucose, Bld 76 70 - 99 mg/dL   BUN <5 (L) 6 - 20 mg/dL   Creatinine, Ser 2.92 0.44 - 1.00 mg/dL   Calcium 9.2 8.9 - 44.6 mg/dL   Total Protein 6.3 (L) 6.5 - 8.1 g/dL   Albumin 2.9 (L) 3.5 - 5.0 g/dL   AST 20 15 - 41 U/L   ALT 8 0 - 44 U/L   Alkaline Phosphatase 212 (H) 38 - 126 U/L   Total Bilirubin 1.1 0.3 - 1.2 mg/dL   GFR calc non Af Amer >60 >60 mL/min   GFR calc Af Amer >60 >60 mL/min   Anion gap 10 5 - 15  Protein / creatinine ratio, urine   Collection Time: 01/30/19 10:47 AM  Result Value Ref Range   Creatinine, Urine 60.86 mg/dL   Total Protein, Urine 26 mg/dL   Protein Creatinine  Ratio 0.43 (H) 0.00 - 0.15 mg/mg[Cre]    Patient Active Problem List   Diagnosis Date Noted  .  Supervision of other normal pregnancy, antepartum 2018-10-31  . History of neonatal death 10/06/18  . Gestational hypertension without significant proteinuria during pregnancy in third trimester, antepartum 07/01/2016  . Encounter for supervision of other normal pregnancy in first trimester 12/23/2015  . Heart murmur     Assessment: Debbie Wood is a 28 y.o. [id number] at [redacted]w[redacted]d here for IOL for new onset pre-eclampsia without severe features.  #Labor: Cytotec x 1, FB placed  #Pain: IV pain meds/Epidural upon request #FWB:  Cat I #ID:  GBS neg #Circ:  N/A  #gHTN  Preeclampsia without severe features: Presented with headache, scotoma, and epigastric pain, s/p Fioricet x 1 with resolution of symptoms. UPC: 0.43  AST/ALT: 20/8  Plts 279   CMP WNL. Max blood pressure 145/78.  -- Plan above for induction of labor and delivery of baby -- continue to monitor BP's -- IV Labetalol for BP >160/>110 -- Start mag if develops severe range pressures or severe symptoms  #Postpartum Planning -- Depo -- RI/Tdap declined/flu declined   #Vaginal Candidiasis  Bacterial Vaginitis: Diagnosed on 01/27/19. Did not pick up rx prior to admission. Endorses vaginal discomfort. - Plan for Diflucan postpartum  - Start Metronidazole  BID x 7 days  Joana Reamer 01/30/2019, 12:27 PM   CNM attestation:  I have seen and examined this patient; I agree with above documentation in the resident's note.   Debbie Wood is a 28 y.o. [id number] here for IOL due to preeclampsia without severe features. She had a mild H/A earlier which resolved with Fioricet; she was also seeing 'floaters' earlier which has also resolved; denies RUQ pain. Extensive questioning re her first child's death as a neonate, which she reports, on autopsy, was shown to be from HSV from an external source. Patient reports she  was tested for HSV and was found to be negative and has never had any lesions/outbreaks in her life.  PE: BP (!) 146/67   Pulse 70   Temp 98.9 F (37.2 C) (Oral)   Resp 16   Ht  (1.6 m)   Wt 64.9 kg   LMP 05/13/2018   SpO2 100%   BMI 25.33 kg/m  Gen: calm comfortable, NAD Resp: normal effort, no distress Abd: gravid  ROS, labs, PMH reviewed  Plan: -Admit to Labor and Delivery -Plan cytotec and cervical foley to start as induction methods, followed by Pit/AROM prn -Discussed with pt the potential need for mag sulfate therapy if she develops either severe range pressures or symptoms of pre-e causing her dx to change to pre-e with severe features -Anticipate SVD  Arabella Merles CNM 01/30/2019, 3:25 PM

## 2019-01-31 ENCOUNTER — Encounter (HOSPITAL_COMMUNITY): Payer: Self-pay

## 2019-01-31 DIAGNOSIS — Z3A37 37 weeks gestation of pregnancy: Secondary | ICD-10-CM

## 2019-01-31 DIAGNOSIS — O1404 Mild to moderate pre-eclampsia, complicating childbirth: Secondary | ICD-10-CM

## 2019-01-31 LAB — RPR: RPR Ser Ql: NONREACTIVE

## 2019-01-31 MED ORDER — SIMETHICONE 80 MG PO CHEW: 80.0000 mg | CHEWABLE_TABLET | ORAL | Status: DC | PRN

## 2019-01-31 MED ORDER — ONDANSETRON HCL 4 MG PO TABS: 4.0000 mg | ORAL_TABLET | ORAL | Status: DC | PRN

## 2019-01-31 MED ORDER — OXYCODONE HCL 5 MG PO TABS
5.0000 mg | ORAL_TABLET | ORAL | Status: DC | PRN
  Administered 2019-01-31 – 2019-02-01 (×4): 5 mg via ORAL
  Filled 2019-01-31 (×5): qty 1

## 2019-01-31 MED ORDER — DIBUCAINE 1 % RE OINT: 1.0000 "application " | TOPICAL_OINTMENT | RECTAL | Status: DC | PRN

## 2019-01-31 MED ORDER — ONDANSETRON HCL 4 MG/2ML IJ SOLN: 4.0000 mg | INTRAMUSCULAR | Status: DC | PRN

## 2019-01-31 MED ORDER — SENNOSIDES-DOCUSATE SODIUM 8.6-50 MG PO TABS
2.0000 | ORAL_TABLET | ORAL | Status: DC
  Administered 2019-01-31 – 2019-02-01 (×2): 2 via ORAL
  Filled 2019-01-31 (×2): qty 2

## 2019-01-31 MED ORDER — BENZOCAINE-MENTHOL 20-0.5 % EX AERO: 1.0000 "application " | INHALATION_SPRAY | CUTANEOUS | Status: DC | PRN

## 2019-01-31 MED ORDER — ACETAMINOPHEN 325 MG PO TABS
650.0000 mg | ORAL_TABLET | ORAL | Status: DC | PRN
  Administered 2019-01-31 – 2019-02-01 (×4): 650 mg via ORAL
  Filled 2019-01-31 (×5): qty 2

## 2019-01-31 MED ORDER — COCONUT OIL OIL: 1.0000 "application " | TOPICAL_OIL | Status: DC | PRN

## 2019-01-31 MED ORDER — TETANUS-DIPHTH-ACELL PERTUSSIS 5-2.5-18.5 LF-MCG/0.5 IM SUSP: 0.5000 mL | Freq: Once | INTRAMUSCULAR | Status: DC

## 2019-01-31 MED ORDER — ZOLPIDEM TARTRATE 5 MG PO TABS: 5.0000 mg | ORAL_TABLET | Freq: Every evening | ORAL | Status: DC | PRN

## 2019-01-31 MED ORDER — PRENATAL MULTIVITAMIN CH
1.0000 | ORAL_TABLET | Freq: Every day | ORAL | Status: DC
  Administered 2019-01-31 – 2019-02-02 (×3): 1 via ORAL
  Filled 2019-01-31 (×3): qty 1

## 2019-01-31 MED ORDER — WITCH HAZEL-GLYCERIN EX PADS: 1.0000 "application " | MEDICATED_PAD | CUTANEOUS | Status: DC | PRN

## 2019-01-31 MED ORDER — IBUPROFEN 600 MG PO TABS
600.0000 mg | ORAL_TABLET | Freq: Four times a day (QID) | ORAL | Status: DC
  Administered 2019-01-31 – 2019-02-02 (×10): 600 mg via ORAL
  Filled 2019-01-31 (×10): qty 1

## 2019-01-31 MED ORDER — DIPHENHYDRAMINE HCL 25 MG PO CAPS: 25.0000 mg | ORAL_CAPSULE | Freq: Four times a day (QID) | ORAL | Status: DC | PRN

## 2019-01-31 NOTE — Lactation Note (Signed)
This note was copied from a baby's chart. Lactation Consultation Note  Patient Name: Debbie Wood [id number] Date: 01/31/2019 Reason for consult: Initial assessment;Early term 37-38.6wks P3, 21 hour female infant, ETI. Per mom, infant has been latch well. 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  Mom latched infant on right breast using the cross cradle hold, infant breastfeed for 15 minutes, audible swallowing observed by LC. Mom doesn't have a breast pump a hand pump was given and explained how to use, assemble, clean and store. Mom knows to breastfeed according hunger cues, 8 or more times within 24 hours. Mom demonstrated hand expression and easily expressed 3 ml of colostrum that she later plans to give to infant on a spoon.  Mom will do as much STS as possible. LC discussed I & O. Reviewed Baby & Me book's Breastfeeding Basics.  Mom knows to call Nurse or LC if she has any questions, concerns or needs assistance with latching infant to breast.  Mom made aware of O/P services, breastfeeding support groups, community resources, and our phone # for post-discharge questions.   Maternal Data Formula Feeding for Exclusion: No Has patient been taught Hand Expression?: Yes(Mom hand expressed 3 ml of colostrum ) Does the patient have breastfeeding experience prior to this delivery?: Yes  Feeding Feeding Type: Breast Fed  LATCH Score Latch: Grasps breast easily, tongue down, lips flanged, rhythmical sucking.  Audible Swallowing: Spontaneous and intermittent  Type of Nipple: Everted at rest and after stimulation  Comfort (Breast/Nipple): Soft / non-tender  Hold (Positioning): Assistance needed to correctly position infant at breast and maintain latch.  LATCH Score: 9  Interventions Interventions: Breast feeding basics reviewed;Assisted with latch;Expressed milk;Position options;Skin to skin;Breast massage;Hand  express;Support pillows;Adjust position;Breast compression;Hand pump  Lactation Tools Discussed/Used Tools: Pump WIC Program: No Pump Review: Setup, frequency, and cleaning Initiated by:: Danelle Earthly, IBCLC Date initiated:: 01/31/19   Consult Status Consult Status: Follow-up Date: 02/01/19 Follow-up type: In-patient    Danelle Earthly 01/31/2019, 10:07 PM

## 2019-01-31 NOTE — Anesthesia Postprocedure Evaluation (Signed)
Anesthesia Post Note  Patient: Debbie Wood  Procedure(s) Performed: AN AD HOC LABOR EPIDURAL     Patient location during evaluation: Mother Baby Anesthesia Type: Epidural Level of consciousness: awake and alert Pain management: pain level controlled Vital Signs Assessment: post-procedure vital signs reviewed and stable Respiratory status: spontaneous breathing, nonlabored ventilation and respiratory function stable Cardiovascular status: stable Postop Assessment: no headache, no backache and epidural receding Anesthetic complications: no    Last Vitals:  Vitals:   01/31/19 0225 01/31/19 0325  BP: 132/76 122/74  Pulse: (!) 102 85  Resp: 18 18  Temp: 36.7 C 37.1 C  SpO2:      Last Pain:  Vitals:   01/31/19 0625  TempSrc:   PainSc: 6    Pain Goal: Patients Stated Pain Goal: 0 (01/30/19 1007)                 Emmaline Kluver N

## 2019-01-31 NOTE — Discharge Summary (Addendum)
Postpartum Discharge Summary     Patient Name: Debbie Wood DOB: Nov 02, 1991 MRN: [id number]  Date of admission: 01/30/2019 Delivering Provider: Cam Hai D   Date of discharge: 02/02/2019  Admitting diagnosis: CTX Intrauterine pregnancy: [redacted]w[redacted]d     Secondary diagnosis:  Active Problems:   Heart murmur   History of neonatal death   Preeclampsia  Additional problems: none     Discharge diagnosis: Term Pregnancy Delivered and Preeclampsia (mild)                                                                                                Post partum procedures:None  Augmentation: AROM, Pitocin, Cytotec and Foley Balloon  Complications: None  Hospital course:  Induction of Labor With Vaginal Delivery   28 y.o. yo [id number] at [redacted]w[redacted]d was admitted to the hospital 01/30/2019 for induction of labor.  Indication for induction: Preeclampsia.  She presented to the [facility] for labor eval and was noted to have elevated BPs and P/C ratio 0.43, nl labs, and was admitted for IOL. Initially with H/A that resolved with Fioricet. She never had severe range BPs in labor and did not require mag sulfate IP.   Membrane Rupture Time/Date: 9:20 PM ,01/30/2019   Intrapartum Procedures: Episiotomy: None [1]                                         Lacerations:  None [1]  Patient had delivery of a Viable infant.  Information for the patient's newborn:  Mills, Autry [[id number]]  Delivery Method: Vaginal, Spontaneous(Filed from Delivery Summary)   01/31/2019  Details of delivery can be found in separate delivery note.  Patient had a routine postpartum course. Patient is discharged home 02/02/19.  Magnesium Sulfate recieved: No BMZ received: No  Physical exam  Vitals:   02/01/19 0546 02/01/19 1351 02/01/19 2142 02/02/19 0605  BP: 122/84 124/66 118/74 133/84  Pulse: 74 80 71 73  Resp: 16 16 18 17   Temp: 98.5 F (36.9 C) 98.4 F (36.9 C) 98.2 F (36.8 C) 98.2 F (36.8 C)   TempSrc: Oral Oral Oral Oral  SpO2:  100%    Weight:      Height:       General: alert, cooperative and no distress Lochia: appropriate Uterine Fundus: firm Incision: N/A DVT Evaluation: No evidence of DVT seen on physical exam. Negative Homan's sign. No significant calf/ankle edema. Labs: Lab Results  Component Value Date   WBC 14.0 (H) 01/30/2019   HGB 8.4 (L) 01/30/2019   HCT 27.3 (L) 01/30/2019   MCV 75.8 (L) 01/30/2019   PLT 249 01/30/2019   CMP Latest Ref Rng & Units 01/30/2019  Glucose 70 - 99 mg/dL 76  BUN 6 - 20 mg/dL <5(Y)  Creatinine 8.50 - 1.00 mg/dL 2.77  Sodium 412 - 878 mmol/L 135  Potassium 3.5 - 5.1 mmol/L 3.7  Chloride 98 - 111 mmol/L 107  CO2 22 - 32 mmol/L 18(L)  Calcium 8.9 - 10.3 mg/dL 9.2  Total Protein 6.5 - 8.1 g/dL 6.3(L)  Total Bilirubin 0.3 - 1.2 mg/dL 1.1  Alkaline Phos 38 - 126 U/L 212(H)  AST 15 - 41 U/L 20  ALT 0 - 44 U/L 8    Discharge instruction: per After Visit Summary and "Baby and Me Booklet".  After visit meds:  Allergies as of 02/02/2019   No Known Allergies     Medication List    STOP taking these medications   tinidazole 500 MG tablet Commonly known as:  TINDAMAX     TAKE these medications   CONCEPT OB 130-92.4-1 MG Caps Take 1 tablet by mouth daily.   enalapril 2.5 MG tablet Commonly known as:  VASOTEC Take 1 tablet (2.5 mg total) by mouth daily for 7 days.   ferrous sulfate 325 (65 FE) MG tablet Take 1 tablet (325 mg total) by mouth 2 (two) times daily with a meal.   ibuprofen 600 MG tablet Commonly known as:  ADVIL,MOTRIN Take 1 tablet (600 mg total) by mouth every 6 (six) hours.   senna-docusate 8.6-50 MG tablet Commonly known as:  Senokot-S Take 2 tablets by mouth daily. Start taking on:  February 03, 2019   Tdap 5-2.5-18.5 LF-MCG/0.5 injection Commonly known as:  BOOSTRIX Inject 0.5 mLs into the muscle once for 1 dose.   terconazole 0.4 % vaginal cream Commonly known as:  TERAZOL 7 Place 1  applicator vaginally at bedtime.       Diet: routine diet  Activity: Advance as tolerated. Pelvic rest for 6 weeks.   Outpatient follow up: 1 week for BP check and 4 weeks for postpartum appointment   Follow up Appt: Future Appointments  Date Time Provider Department Center  02/06/2019 10:00 AM [facility] NURSE [facility] None  02/27/2019  9:30 AM Conan Bowens, MD [facility] None   Follow up Visit: Follow-up Information    [facility]. Go on 02/06/2019.   Why:  at 10:00 AM for Blood Pressure Check Contact information: [address] [place] [address] [phone]       Conan Bowens, MD. Go on 02/27/2019.   Specialty:  Obstetrics and Gynecology Why:  at 9:30 am for 4 week post-partum visit Contact information: [address] [place] [address] [phone]           Please schedule this patient for Postpartum visit in: 1 week with the following provider: RN, then 4wk PP visit For C/S patients schedule nurse incision check in weeks 2 weeks: no High risk pregnancy complicated by: pre-eclampsia Delivery mode:  SVD Anticipated Birth Control:  Depo PP Procedures needed: BP check (1wk) Schedule Integrated BH visit: no   Newborn Data: Live born female  Birth Weight:  3036g APGAR: 8,9   Newborn Delivery   Birth date/time:  01/31/2019 00:36:00 Delivery type:  Vaginal, Spontaneous     Baby Feeding: Breast Disposition:home with mother   02/02/2019 Joana Reamer, DO  I confirm that I have verified the information documented in the resident's note and that I have also personally reperformed the history, physical exam and all medical decision making activities of this service and have verified that all service and findings are accurately documented in this resident's note.   Rx for Amlodipine sent to pharmacy by me   Sharyon Cable, CNM 02/02/2019 11:01 AM

## 2019-02-01 NOTE — Lactation Note (Signed)
This note was copied from a baby's chart. Lactation Consultation Note  Patient Name: Debbie Wood [id number] Date: 02/01/2019 Reason for consult: Follow-up assessment;Early term 15-38.6wks  P3 mother whose infant is now 45 hours old.  Mother breast fed her first child for 2 weeks (now 30 years old) and her second child for 1 month (now 48 years old).  Baby was sleeping in mother's arms when I arrived.  She had recently been at the breast.  Mother's breasts are soft and non tender and nipples are everted and intact.  Mother feels like breast feeding is going well so far and denies pain with latching.  Encouraged to continue feeding 8-12 times/24 hours or sooner if baby shows feeding cues.  Mother had approximately 1 ml of EBM in her refrigerator from hand expressing and I encouraged her to finger feed or spoon feed any EBM she obtains to baby.  Mother will call for assistance as needed.     Maternal Data Formula Feeding for Exclusion: No Has patient been taught Hand Expression?: Yes Does the patient have breastfeeding experience prior to this delivery?: Yes  Feeding Feeding Type: Breast Fed  LATCH Score                   Interventions    Lactation Tools Discussed/Used WIC Program: No   Consult Status Consult Status: Follow-up Date: 02/02/19 Follow-up type: In-patient    Debbie Wood 02/01/2019, 2:24 PM

## 2019-02-01 NOTE — Progress Notes (Signed)
Post Partum Day 1 Subjective: No complaints, up ad lib, voiding, tolerating PO and + flatus. Breastfeeding, baby stable at bedside.  Objective: Blood pressure 122/84, pulse 74, temperature 98.5 F (36.9 C), temperature source Oral, resp. rate 16, height 5\' 3"  (1.6 m), weight 64.9 kg, last menstrual period 05/13/2018, SpO2 100 %, unknown if currently breastfeeding.  Physical Exam:  General: alert and no distress Lochia: appropriate Uterine Fundus: firm DVT Evaluation: No evidence of DVT seen on physical exam. Negative Homan's sign. No cords or calf tenderness. No significant calf/ankle edema.  Recent Labs    01/30/19 1030 01/30/19 2040  HGB 9.0* 8.4*  HCT 29.1* 27.3*    Assessment/Plan: Plan for discharge tomorrow, Breastfeeding and Contraception Depo Provera   LOS: 2 days   Debbie Wood 02/01/2019, 10:33 AM

## 2019-02-02 MED ORDER — IBUPROFEN 600 MG PO TABS: 600.0000 mg | ORAL_TABLET | Freq: Four times a day (QID) | ORAL | 0 refills | Status: DC

## 2019-02-02 MED ORDER — AMLODIPINE BESYLATE 5 MG PO TABS: 5.0000 mg | ORAL_TABLET | Freq: Every day | ORAL | 0 refills | Status: DC

## 2019-02-02 MED ORDER — TETANUS-DIPHTH-ACELL PERTUSSIS 5-2.5-18.5 LF-MCG/0.5 IM SUSP: 0.5000 mL | Freq: Once | INTRAMUSCULAR | 0 refills | Status: AC

## 2019-02-02 MED ORDER — SENNOSIDES-DOCUSATE SODIUM 8.6-50 MG PO TABS: 2.0000 | ORAL_TABLET | ORAL | 0 refills | Status: DC

## 2019-02-02 MED ORDER — ENALAPRIL MALEATE 2.5 MG PO TABS: 2.5000 mg | ORAL_TABLET | Freq: Every day | ORAL | 0 refills | Status: DC

## 2019-02-02 NOTE — Lactation Note (Signed)
This note was copied from a baby's chart. Lactation Consultation Note:  Mother reports that infant is feeding well.  She reports having lots of colostrum and that breast are filling.  Encouraged mother to rouse infant and feed from both breast.  Mother has a harmony hand pump . Advised to post pump after each feeding for 15 mins and give infant any amt of hand expressed milk.  Mother reports that she has a DEBP at home. Discussed LPI and infants behavior .  Informed mother that infant may need supplement of ebm after each feeding.  Suggested that mother page LC when infant is ready for next feeding.  Explained to mother that early infants may not transfer milk as well. Discussed doing breast compression to insure that infant is draining breast well.   Discussed treatment and prevention of engorgement.   Advised mother to cue base feed and feed infant 8-12 times in 24 hours. Mother advised to allow for cluster feeding.   Mother is aware of available LC services. Mother reports that she has an appt on Wednesday with WIC.   Patient Name: Debbie Wood [id number] Date: 02/02/2019 Reason for consult: Follow-up assessment   Maternal Data    Feeding Feeding Type: Breast Fed  LATCH Score                   Interventions Interventions: Hand express;Expressed milk;Hand pump(all D/C teaching completed. )  Lactation Tools Discussed/Used     Consult Status Consult Status: Complete    Michel Bickers 02/02/2019, 11:44 AM

## 2019-02-02 NOTE — Discharge Instructions (Signed)
Vaginal Delivery, Care After °Refer to this sheet in the next few weeks. These instructions provide you with information about caring for yourself after vaginal delivery. Your health care provider may also give you more specific instructions. Your treatment has been planned according to current medical practices, but problems sometimes occur. Call your health care provider if you have any problems or questions. °What can I expect after the procedure? °After vaginal delivery, it is common to have: °· Some bleeding from your vagina. °· Soreness in your abdomen, your vagina, and the area of skin between your vaginal opening and your anus (perineum). °· Pelvic cramps. °· Fatigue. °Follow these instructions at home: °Medicines °· Take over-the-counter and prescription medicines only as told by your health care provider. °· If you were prescribed an antibiotic medicine, take it as told by your health care provider. Do not stop taking the antibiotic until it is finished. °Driving ° °· Do not drive or operate heavy machinery while taking prescription pain medicine. °· Do not drive for 24 hours if you received a sedative. °Lifestyle °· Do not drink alcohol. This is especially important if you are breastfeeding or taking medicine to relieve pain. °· Do not use tobacco products, including cigarettes, chewing tobacco, or e-cigarettes. If you need help quitting, ask your health care provider. °Eating and drinking °· Drink at least 8 eight-ounce glasses of water every day unless you are told not to by your health care provider. If you choose to breastfeed your baby, you may need to drink more water than this. °· Eat high-fiber foods every day. These foods may help prevent or relieve constipation. High-fiber foods include: °? Whole grain cereals and breads. °? Brown rice. °? Beans. °? Fresh fruits and vegetables. °Activity °· Return to your normal activities as told by your health care provider. Ask your health care provider what  activities are safe for you. °· Rest as much as possible. Try to rest or take a nap when your baby is sleeping. °· Do not lift anything that is heavier than your baby or 10 lb (4.5 kg) until your health care provider says that it is safe. °· Talk with your health care provider about when you can engage in sexual activity. This may depend on your: °? Risk of infection. °? Rate of healing. °? Comfort and desire to engage in sexual activity. °Vaginal Care °· If you have an episiotomy or a vaginal tear, check the area every day for signs of infection. Check for: °? More redness, swelling, or pain. °? More fluid or blood. °? Warmth. °? Pus or a bad smell. °· Do not use tampons or douches until your health care provider says this is safe. °· Watch for any blood clots that may pass from your vagina. These may look like clumps of dark red, brown, or black discharge. °General instructions °· Keep your perineum clean and dry as told by your health care provider. °· Wear loose, comfortable clothing. °· Wipe from front to back when you use the toilet. °· Ask your health care provider if you can shower or take a bath. If you had an episiotomy or a perineal tear during labor and delivery, your health care provider may tell you not to take baths for a certain length of time. °· Wear a bra that supports your breasts and fits you well. °· If possible, have someone help you with household activities and help care for your baby for at least a few days after you   leave the hospital. °· Keep all follow-up visits for you and your baby as told by your health care provider. This is important. °Contact a health care provider if: °· You have: °? Vaginal discharge that has a bad smell. °? Difficulty urinating. °? Pain when urinating. °? A sudden increase or decrease in the frequency of your bowel movements. °? More redness, swelling, or pain around your episiotomy or vaginal tear. °? More fluid or blood coming from your episiotomy or vaginal  tear. °? Pus or a bad smell coming from your episiotomy or vaginal tear. °? A fever. °? A rash. °? Little or no interest in activities you used to enjoy. °? Questions about caring for yourself or your baby. °· Your episiotomy or vaginal tear feels warm to the touch. °· Your episiotomy or vaginal tear is separating or does not appear to be healing. °· Your breasts are painful, hard, or turn red. °· You feel unusually sad or worried. °· You feel nauseous or you vomit. °· You pass large blood clots from your vagina. If you pass a blood clot from your vagina, save it to show to your health care provider. Do not flush blood clots down the toilet without having your health care provider look at them. °· You urinate more than usual. °· You are dizzy or light-headed. °· You have not breastfed at all and you have not had a menstrual period for 12 weeks after delivery. °· You have stopped breastfeeding and you have not had a menstrual period for 12 weeks after you stopped breastfeeding. °Get help right away if: °· You have: °? Pain that does not go away or does not get better with medicine. °? Chest pain. °? Difficulty breathing. °? Blurred vision or spots in your vision. °? Thoughts about hurting yourself or your baby. °· You develop pain in your abdomen or in one of your legs. °· You develop a severe headache. °· You faint. °· You bleed from your vagina so much that you fill two sanitary pads in one hour. °This information is not intended to replace advice given to you by your health care provider. Make sure you discuss any questions you have with your health care provider. °Document Released: 11/16/2000 Document Revised: 05/02/2016 Document Reviewed: 12/04/2015 °Elsevier Interactive Patient Education © 2019 Elsevier Inc. ° °

## 2019-02-03 ENCOUNTER — Encounter: Payer: Medicaid Other | Admitting: Obstetrics

## 2019-02-06 ENCOUNTER — Encounter: Payer: Self-pay | Admitting: Obstetrics

## 2019-02-06 ENCOUNTER — Ambulatory Visit (INDEPENDENT_AMBULATORY_CARE_PROVIDER_SITE_OTHER): Payer: Medicaid Other | Admitting: Obstetrics

## 2019-02-06 DIAGNOSIS — F419 Anxiety disorder, unspecified: Secondary | ICD-10-CM

## 2019-02-06 MED ORDER — ALPRAZOLAM 0.25 MG PO TABS: 0.2500 mg | ORAL_TABLET | Freq: Two times a day (BID) | ORAL | 1 refills | Status: DC

## 2019-02-06 NOTE — Progress Notes (Signed)
Patient ID: Debbie Wood, female   DOB: November 22, 1991, 28 y.o.   MRN: [id number]  Chief Complaint  Patient presents with  . Blood Pressure Check    HPI Debbie Wood is a 28 y.o. female.  Very distressed, upset and crying uncontrollably.  She say that she took her baby to the Pediatrician and was told her baby has a condition in the eyes like jaundice and that worried her because her last baby died at 33 days postpartum. HPI  Past Medical History:  Diagnosis Date  . Chest pain   . Gestational hypertension without significant proteinuria during pregnancy in third trimester, antepartum 07/01/2016  . Heart murmur   . Hypertension affecting pregnancy in third trimester    does not have issues with it anymore  . Pregnancy induced hypertension     Past Surgical History:  Procedure Laterality Date  . INDUCED ABORTION      Family History  Problem Relation Age of Onset  . Hypertension Mother   . Hyperlipidemia Mother   . Hypertension Father   . Heart attack Neg Hx     Social History Social History   Tobacco Use  . Smoking status: Former Smoker    Types: Cigarettes    Last attempt to quit: 06/03/2009    Years since quitting: 9.6  . Smokeless tobacco: Never Used  Substance Use Topics  . Alcohol use: No  . Drug use: No    No Known Allergies  Current Outpatient Medications  Medication Sig Dispense Refill  . enalapril (VASOTEC) 2.5 MG tablet Take 1 tablet (2.5 mg total) by mouth daily for 7 days. 7 tablet 0  . ferrous sulfate 325 (65 FE) MG tablet Take 1 tablet (325 mg total) by mouth 2 (two) times daily with a meal. 60 tablet 5  . ALPRAZolam (XANAX) 0.25 MG tablet Take 1 tablet (0.25 mg total) by mouth 2 (two) times daily. 60 tablet 1  . ibuprofen (ADVIL,MOTRIN) 600 MG tablet Take 1 tablet (600 mg total) by mouth every 6 (six) hours. (Patient not taking: Reported on 02/06/2019) 60 tablet 0  . Prenat w/o A Vit-FeFum-FePo-FA (CONCEPT OB) 130-92.4-1 MG CAPS Take 1 tablet  by mouth daily. (Patient not taking: Reported on 02/06/2019) 30 capsule 12  . senna-docusate (SENOKOT-S) 8.6-50 MG tablet Take 2 tablets by mouth daily. (Patient not taking: Reported on 02/06/2019) 30 tablet 0  . terconazole (TERAZOL 7) 0.4 % vaginal cream Place 1 applicator vaginally at bedtime. (Patient not taking: Reported on 02/06/2019) 45 g 0   No current facility-administered medications for this visit.    Facility-Administered Medications Ordered in Other Visits  Medication Dose Route Frequency Provider Last Rate Last Dose  . lidocaine (PF) (XYLOCAINE) 1 % injection    Anesthesia Intra-op Karie Schwalbe, MD   11 mL at 01/30/19 2218    Review of Systems Review of Systems Constitutional: negative for fatigue and weight loss Respiratory: negative for cough and wheezing Cardiovascular: negative for chest pain, fatigue and palpitations Gastrointestinal: negative for abdominal pain and change in bowel habits Genitourinary:negative Integument/breast: negative for nipple discharge Musculoskeletal:negative for myalgias Neurological: negative for gait problems and tremors Behavioral/Psych: positive for severe anxiety Endocrine: negative for temperature intolerance      unknown if currently breastfeeding.  Physical Exam Physical Exam:  Deferred  >50% of 15 min visit spent on counseling and coordination of care.   Data Reviewed None  Assessment     1. Postpartum care following vaginal delivery  2. Anxiety Rx: -  ALPRAZolam (XANAX) 0.25 MG tablet; Take 1 tablet (0.25 mg total) by mouth 2 (two) times daily.  Dispense: 60 tablet; Refill: 1    Plan    Follow up in 3 weeks  No orders of the defined types were placed in this encounter.  Meds ordered this encounter  Medications  . ALPRAZolam (XANAX) 0.25 MG tablet    Sig: Take 1 tablet (0.25 mg total) by mouth 2 (two) times daily.    Dispense:  60 tablet    Refill:  1      A. MD 02-06-2019

## 2019-02-06 NOTE — Progress Notes (Signed)
Pt was in the office for BP check, BP was elevated and pt was crying during assessment. Pt stated that she has been taking her meds, she states she is not depressed, but has a lot of anxiety about the health of her baby. Pt requested to see provider, notified provider, Edinburgh score 21.

## 2019-02-12 ENCOUNTER — Telehealth: Payer: Self-pay

## 2019-02-12 NOTE — Telephone Encounter (Signed)
The family connects nurse called regarding her visit today with this patient. Nurse states her BP today was 138/94, but patient had not taken her Vasotec 2.5mg  yet today. No other symptoms reported. Nurse states she instructed patient to continue taking medication.

## 2019-02-12 NOTE — Telephone Encounter (Signed)
Agree with continuing meds as directed.   Baldemar Lenis, M.D. Attending [facility])

## 2019-02-18 ENCOUNTER — Telehealth: Payer: Self-pay

## 2019-02-18 NOTE — Telephone Encounter (Signed)
S/w family connect nurse who was following up on pt. Advised of last note and date of pp appt.

## 2019-02-27 ENCOUNTER — Ambulatory Visit: Payer: Medicaid Other | Admitting: Obstetrics and Gynecology

## 2019-05-13 ENCOUNTER — Ambulatory Visit (HOSPITAL_COMMUNITY)
Admission: EM | Admit: 2019-05-13 | Discharge: 2019-05-13 | Disposition: A | Discharge status: Home / Self Care | Payer: Medicaid Other | Attending: Family Medicine | Admitting: Family Medicine

## 2019-05-13 ENCOUNTER — Other Ambulatory Visit: Payer: Self-pay

## 2019-05-13 ENCOUNTER — Encounter (HOSPITAL_COMMUNITY): Payer: Self-pay

## 2019-05-13 DIAGNOSIS — S71111A Laceration without foreign body, right thigh, initial encounter: Secondary | ICD-10-CM | POA: Diagnosis not present

## 2019-05-13 DIAGNOSIS — W268XXA Contact with other sharp object(s), not elsewhere classified, initial encounter: Secondary | ICD-10-CM

## 2019-05-13 DIAGNOSIS — Z23 Encounter for immunization: Secondary | ICD-10-CM | POA: Diagnosis not present

## 2019-05-13 DIAGNOSIS — S81811A Laceration without foreign body, right lower leg, initial encounter: Secondary | ICD-10-CM

## 2019-05-13 MED ORDER — TETANUS-DIPHTH-ACELL PERTUSSIS 5-2.5-18.5 LF-MCG/0.5 IM SUSP
0.5000 mL | Freq: Once | INTRAMUSCULAR | Status: AC
  Administered 2019-05-13: 0.5 mL via INTRAMUSCULAR

## 2019-05-13 MED ORDER — TETANUS-DIPHTH-ACELL PERTUSSIS 5-2.5-18.5 LF-MCG/0.5 IM SUSP
INTRAMUSCULAR | Status: AC
  Filled 2019-05-13: qty 0.5

## 2019-05-13 NOTE — Discharge Instructions (Signed)
Keep area clean and dry. If you develop worsening pain, bleeding or purulent discharge, redness around the area, fever.

## 2019-05-13 NOTE — ED Provider Notes (Signed)
[place]    CSN: [id number] Arrival date & time: 05/13/19  1102     History   Chief Complaint Chief Complaint  Patient presents with  . Laceration    HPI Debbie Wood is a 28 y.o. female presenting for right thigh cut.  Patient states that she was cutting a box open with about cutter last night at 9pm when it slipped and hit her thigh.  Patient was able to control bleeding with direct pressure.  Patient not currently on any anticoagulation, does not take aspirin.  Patient endorsing pain.  No redness, purulent discharge, swelling, bruising, lower extremity paresthesias or numbness.  No personal history of autoimmune disorders, HIV or diabetes.   Past Medical History:  Diagnosis Date  . Chest pain   . Gestational hypertension without significant proteinuria during pregnancy in third trimester, antepartum 07/01/2016  . Heart murmur   . Hypertension affecting pregnancy in third trimester    does not have issues with it anymore  . Pregnancy induced hypertension     Patient Active Problem List   Diagnosis Date Noted  . Preeclampsia 01/30/2019  . Supervision of other normal pregnancy, antepartum 10/23/2018  . History of neonatal death 09/28/2018  . Encounter for supervision of other normal pregnancy in first trimester 12/23/2015  . Heart murmur     Past Surgical History:  Procedure Laterality Date  . INDUCED ABORTION      OB History    Gravida  5   Para  4   Term  4   Preterm  0   AB  1   Living  3     SAB  0   TAB  1   Ectopic  0   Multiple  0   Live Births  4        Obstetric Comments  Pt induced          Home Medications    Prior to Admission medications   Medication Sig Start Date End Date Taking? Authorizing Provider  ALPRAZolam (XANAX) 0.25 MG tablet Take 1 tablet (0.25 mg total) by mouth 2 (two) times daily. 02/06/19   Shelly Bombard, MD  enalapril (VASOTEC) 2.5 MG tablet Take 1 tablet (2.5 mg total) by mouth  daily for 7 days. 02/02/19 02/09/19  Mullis, Kiersten P, DO  ferrous sulfate 325 (65 FE) MG tablet Take 1 tablet (325 mg total) by mouth 2 (two) times daily with a meal. 01/05/19   Shelly Bombard, MD  ibuprofen (ADVIL,MOTRIN) 600 MG tablet Take 1 tablet (600 mg total) by mouth every 6 (six) hours. Patient not taking: Reported on 02/06/2019 02/02/19   Mina Marble P, DO  Prenat w/o A Vit-FeFum-FePo-FA (CONCEPT OB) 130-92.4-1 MG CAPS Take 1 tablet by mouth daily. Patient not taking: Reported on 02/06/2019 09/28/18   Tamala Julian, [place], CNM  senna-docusate (SENOKOT-S) 8.6-50 MG tablet Take 2 tablets by mouth daily. Patient not taking: Reported on 02/06/2019 02/03/19   Mina Marble P, DO  terconazole (TERAZOL 7) 0.4 % vaginal cream Place 1 applicator vaginally at bedtime. Patient not taking: Reported on 02/06/2019 01/29/19   Shelly Bombard, MD    Family History Family History  Problem Relation Age of Onset  . Hypertension Mother   . Hyperlipidemia Mother   . Hypertension Father   . Heart attack Neg Hx     Social History Social History   Tobacco Use  . Smoking status: Former Smoker    Types: Cigarettes  Last attempt to quit: 06/03/2009    Years since quitting: 9.9  . Smokeless tobacco: Never Used  Substance Use Topics  . Alcohol use: No  . Drug use: No     Allergies   Patient has no known allergies.   Review of Systems As per HPI   Physical Exam Triage Vital Signs ED Triage Vitals  Enc Vitals Group     BP 05/13/19 1133 105/64     Pulse Rate 05/13/19 1133 86     Resp 05/13/19 1133 18     Temp 05/13/19 1133 98.5 F (36.9 C)     Temp Source 05/13/19 1133 Oral     SpO2 05/13/19 1133 100 %     Weight 05/13/19 1132 125 lb (56.7 kg)     Height --      Head Circumference --      Peak Flow --      Pain Score 05/13/19 1131 5     Pain Loc --      Pain Edu? --      Excl. in GC? --    No data found.  Updated Vital Signs BP 105/64 (BP Location: Right Arm)   Pulse 86   Temp  98.5 F (36.9 C) (Oral)   Resp 18   Wt 125 lb (56.7 kg)   SpO2 100%   BMI 22.14 kg/m   Visual Acuity Right Eye Distance:   Left Eye Distance:   Bilateral Distance:    Right Eye Near:   Left Eye Near:    Bilateral Near:     Physical Exam Constitutional:      General: She is not in acute distress. HENT:     Head: Normocephalic and atraumatic.  Eyes:     General: No scleral icterus.    Pupils: Pupils are equal, round, and reactive to light.  Cardiovascular:     Rate and Rhythm: Normal rate.     Comments: DP pulses 2+ bilaterally. Pulmonary:     Effort: Pulmonary effort is normal.  Skin:    Coloration: Skin is not jaundiced or pale.     Comments: 1.5 cm, superficial laceration of right lateral thigh.  Wound edges with smooth tissue that appears to be healing well.  Wound thoroughly explored, irrigated, cleaned with Hibiclens.  Purulent discharge, surrounding erythema.  Area is tender to palpation, NVI.  Neurological:     Mental Status: She is alert and oriented to person, place, and time.      UC Treatments / Results  Labs (all labs ordered are listed, but only abnormal results are displayed) Labs Reviewed - No data to display  EKG None  Radiology No results found.  Procedures Procedures (including critical care time)  Medications Ordered in UC Medications  Tdap (BOOSTRIX) injection 0.5 mL (0.5 mLs Intramuscular Given 05/13/19 1207)    Initial Impression / Assessment and Plan / UC Course  I have reviewed the triage vital signs and the nursing notes.  Pertinent labs & imaging results that were available during my care of the patient were reviewed by me and considered in my medical decision making (see chart for details).     28 year old female with superficial right thigh laceration.  Wound is thoroughly explored, irrigated and cleaned.  Patient refused sutures, prefering Steri-Strips.  These were applied in office, which patient tolerated well.  Return  precautions discussed, patient verbalized understanding. Final Clinical Impressions(s) / UC Diagnoses   Final diagnoses:  Laceration of right lower extremity, initial encounter  Discharge Instructions     Keep area clean and dry. If you develop worsening pain, bleeding or purulent discharge, redness around the area, fever.    ED Prescriptions    None     Controlled Substance Prescriptions [place] Controlled Substance Registry consulted? Not Applicable   Shea EvansHall-Potvin, Brittany, [place]-C 05/13/19 1235

## 2019-05-13 NOTE — ED Triage Notes (Signed)
Pt states she was cutting open a box and she cut her right leg with a box cutter last night.

## 2019-06-09 ENCOUNTER — Other Ambulatory Visit: Payer: Self-pay

## 2019-06-09 ENCOUNTER — Ambulatory Visit (INDEPENDENT_AMBULATORY_CARE_PROVIDER_SITE_OTHER): Payer: Medicaid Other | Admitting: Obstetrics

## 2019-06-09 ENCOUNTER — Encounter: Payer: Self-pay | Admitting: Obstetrics

## 2019-06-09 ENCOUNTER — Other Ambulatory Visit (HOSPITAL_COMMUNITY)
Admission: RE | Admit: 2019-06-09 | Discharge: 2019-06-09 | Disposition: A | Discharge status: Home / Self Care | Payer: Medicaid Other | Source: Ambulatory Visit | Attending: Obstetrics | Admitting: Obstetrics

## 2019-06-09 VITALS — BP 128/85 | HR 66 | Ht 63.0 in | Wt 132.0 lb

## 2019-06-09 DIAGNOSIS — Z01419 Encounter for gynecological examination (general) (routine) without abnormal findings: Secondary | ICD-10-CM | POA: Diagnosis not present

## 2019-06-09 DIAGNOSIS — F419 Anxiety disorder, unspecified: Secondary | ICD-10-CM

## 2019-06-09 DIAGNOSIS — Z124 Encounter for screening for malignant neoplasm of cervix: Secondary | ICD-10-CM | POA: Insufficient documentation

## 2019-06-09 DIAGNOSIS — N898 Other specified noninflammatory disorders of vagina: Secondary | ICD-10-CM | POA: Diagnosis not present

## 2019-06-09 MED ORDER — ALPRAZOLAM 0.25 MG PO TABS: 0.2500 mg | ORAL_TABLET | Freq: Two times a day (BID) | ORAL | 2 refills | Status: DC

## 2019-06-09 NOTE — Progress Notes (Signed)
    TELEHEALTH GYNECOLOGY VIRTUAL VIDEO VISIT ENCOUNTER NOTE  Provider location: [facility] at [place]   I connected with Jolaine Click on 06/09/19 at  9:15 AM EDT by [facility] Gyn MyChart Video Encounter at home and verified that I am speaking with the correct person using two identifiers.   I discussed the limitations, risks, security and privacy concerns of performing an evaluation and management service virtually and the availability of in person appointments. I also discussed with the patient that there may be a patient responsible charge related to this service. The patient expressed understanding and agreed to proceed.   History:  Debbie Wood is a 28 y.o. [phone] female being evaluated today for annual exam and pap. She denies any abnormal vaginal discharge, bleeding, pelvic pain or other concerns.       Past Medical History:  Diagnosis Date  . Chest pain   . Gestational hypertension without significant proteinuria during pregnancy in third trimester, antepartum 07/01/2016  . Heart murmur   . Hypertension affecting pregnancy in third trimester    does not have issues with it anymore  . Pregnancy induced hypertension    Past Surgical History:  Procedure Laterality Date  . INDUCED ABORTION     The following portions of the patient's history were reviewed and updated as appropriate: allergies, current medications, past family history, past medical history, past social history, past surgical history and problem list.   Health Maintenance:  Normal pap and negative HRHPV on 03-07-2018.   Review of Systems:  Pertinent items noted in HPI and remainder of comprehensive ROS otherwise negative.  Physical Exam:   General:  Alert, oriented and cooperative. Patient appears to be in no acute distress.  Mental Status: Normal mood and affect. Normal behavior. Normal judgment and thought content.   Respiratory: Normal respiratory effort, no problems with  respiration noted  Rest of physical exam deferred due to type of encounter  Labs and Imaging No results found for this or any previous visit (from the past 336 hour(s)). No results found.     Assessment and Plan:     1. Women's annual routine gynecological examination  2. Pap smear for cervical cancer screening Rx: - Cytology - PAP( )  3. Anxiety Rx: - Ambulatory referral to [facility]       I discussed the assessment and treatment plan with the patient. The patient was provided an opportunity to ask questions and all were answered. The patient agreed with the plan and demonstrated an understanding of the instructions.   The patient was advised to call back or seek an in-person evaluation/go to the ED if the symptoms worsen or if the condition fails to improve as anticipated.  I provided 20 minutes of face-to-face time during this encounter.   Baltazar Najjar, MD Center for [facility], [facility] 06-09-2019

## 2019-06-10 ENCOUNTER — Other Ambulatory Visit: Payer: Self-pay | Admitting: Obstetrics

## 2019-06-10 DIAGNOSIS — B9689 Other specified bacterial agents as the cause of diseases classified elsewhere: Secondary | ICD-10-CM

## 2019-06-10 DIAGNOSIS — N760 Acute vaginitis: Secondary | ICD-10-CM

## 2019-06-10 LAB — CERVICOVAGINAL ANCILLARY ONLY
Bacterial vaginitis: POSITIVE — AB
Candida vaginitis: NEGATIVE

## 2019-06-10 MED ORDER — TINIDAZOLE 500 MG PO TABS: 1000.0000 mg | ORAL_TABLET | Freq: Every day | ORAL | 2 refills | Status: DC

## 2019-06-11 LAB — CYTOLOGY - PAP: Diagnosis: NEGATIVE

## 2019-07-08 ENCOUNTER — Telehealth: Payer: Self-pay | Admitting: Obstetrics & Gynecology

## 2019-07-08 NOTE — Telephone Encounter (Signed)
Called pt. to inform of upcoming visit tomorrow via [place]. The patient is aware and verbalized understanding.

## 2019-07-09 ENCOUNTER — Other Ambulatory Visit: Payer: Self-pay

## 2019-07-09 ENCOUNTER — Telehealth: Payer: Self-pay | Admitting: Clinical

## 2019-07-09 DIAGNOSIS — Z5329 Procedure and treatment not carried out because of patient's decision for other reasons: Secondary | ICD-10-CM

## 2019-07-09 DIAGNOSIS — Z91199 Patient's noncompliance with other medical treatment and regimen due to unspecified reason: Secondary | ICD-10-CM

## 2019-07-09 NOTE — BH Specialist Note (Signed)
Pt did not arrive to MyChart video visit and did not answer the phone; Left HIPPA-compliant message to call back [name] from [facility] at [phone], and left MyChart visit for patient.    [place] via Telemedicine Video Visit  07/09/2019 Debbie Wood [id number]  Garlan Fair

## 2019-09-05 ENCOUNTER — Emergency Department (HOSPITAL_COMMUNITY)
Admission: EM | Admit: 2019-09-05 | Discharge: 2019-09-06 | Disposition: A | Discharge status: Home / Self Care | Payer: Medicaid Other | Attending: Emergency Medicine | Admitting: Emergency Medicine

## 2019-09-05 DIAGNOSIS — Z5321 Procedure and treatment not carried out due to patient leaving prior to being seen by health care provider: Secondary | ICD-10-CM | POA: Diagnosis not present

## 2019-09-05 DIAGNOSIS — Z760 Encounter for issue of repeat prescription: Secondary | ICD-10-CM | POA: Diagnosis present

## 2019-09-06 ENCOUNTER — Other Ambulatory Visit: Payer: Self-pay

## 2019-09-06 ENCOUNTER — Encounter (HOSPITAL_COMMUNITY): Payer: Self-pay | Admitting: Emergency Medicine

## 2019-09-06 NOTE — ED Triage Notes (Signed)
Pt diagnosed with anxiety and post-partum depression by her OB-GYN.  Has been taking Lorazepam for 4 months but out of medication for the last 3 days.  Hasn't slept in the last day.  Requesting medication refill.  States she had appt scheduled for psychiatrist but had to work and didn't reschedule.  Denies SI/HI.

## 2019-09-06 NOTE — ED Notes (Signed)
Per registration, pt has left 

## 2019-09-10 ENCOUNTER — Ambulatory Visit (HOSPITAL_COMMUNITY)
Admission: EM | Admit: 2019-09-10 | Discharge: 2019-09-10 | Disposition: A | Discharge status: Home / Self Care | Payer: Medicaid Other

## 2019-09-10 ENCOUNTER — Other Ambulatory Visit: Payer: Self-pay

## 2019-09-10 NOTE — ED Triage Notes (Signed)
Spoke with pt per SN we are unable to manage requested medication at the [facility]: pt referred to [facility] or ED if worsening sx

## 2019-09-14 NOTE — BH Specialist Note (Addendum)
Integrated Behavioral Health via Telemedicine Telephone Visit  09/14/2019 MARLISHA VANWYK [id number]  Number of Integrated Behavioral Health visits: 1 Session Start time: 8:58  Session End time: 9:32 Total time: 30 minutes  Referring Provider: Coral Ceo, MD Type of Visit: Telephone Patient/Family location: Home [facility] Provider location: Home All persons participating in visit: Patient Forest Pruden and [facility] Hedeen  Confirmed patient's address: Yes  Confirmed patient's phone number: Yes  Any changes to demographics: No   Confirmed patient's insurance: Yes  Any changes to patient's insurance: No   Discussed confidentiality: Yes   I connected with Madelynn A Seehafer  by a video enabled telemedicine application and verified that I am speaking with the correct person using two identifiers.     I discussed the limitations of evaluation and management by telemedicine and the availability of in person appointments.  I discussed that the purpose of this visit is to provide behavioral health care while limiting exposure to the novel coronavirus.   Discussed there is a possibility of technology failure and discussed alternative modes of communication if that failure occurs.  I discussed that engaging in this video visit, they consent to the provision of behavioral healthcare and the services will be billed under their insurance.  Patient and/or legal guardian expressed understanding and consented to video visit: Yes   PRESENTING CONCERNS: Patient and/or family reports the following symptoms/concerns: Pt states her primary concern today is being unmedicated for anxiety and panic attacks, that she was previously treated for with medication; pt open to learning self-coping strategy, as she prefers combination of both medication and coping strategies.  Duration of problem: Ongoing for over a decade; Severity of problem: severe  STRENGTHS (Protective Factors/Coping  Skills): Self-awareness; open to learning; open to treatement  GOALS ADDRESSED: Patient will: 1.  Reduce symptoms of: anxiety and stress  2.  Increase knowledge and/or ability of: coping skills and healthy habits  3.  Demonstrate ability to: Increase healthy adjustment to current life circumstances and Increase motivation to adhere to plan of care  INTERVENTIONS: Interventions utilized:  Mindfulness or Relaxation Training, Medication Monitoring and Psychoeducation and/or Health Education Standardized Assessments completed: GAD-7 and PHQ 9  ASSESSMENT: Patient currently experiencing Generalized anxiety disorder.   Patient may benefit from psychoeducation and brief therapeutic interventions regarding coping with symptoms of anxiety and panic attacks .  PLAN: 1. Follow up with behavioral health clinician on : One week 2. Behavioral recommendations:  -Accept referral to psychiatry -Use walk-in clinic and/or mobile crisis, as needed today -CALM relaxation breathing exercise twice daily(morning; at bedtime) -Consider replacing smoke break at work with 5 minute app, at least once/week -Read educational materials regarding coping with symptoms of anxiety and panic attacks  3. Referral(s): Integrated [facility] (In Clinic) and Psychiatrist  I discussed the assessment and treatment plan with the patient and/or parent/guardian. They were provided an opportunity to ask questions and all were answered. They agreed with the plan and demonstrated an understanding of the instructions.   They were advised to call back or seek an in-person evaluation if the symptoms worsen or if the condition fails to improve as anticipated.  Valetta Close Tyquarius Paglia    Depression screen [facility] 2/9 09/15/2019  Decreased Interest 0  Down, Depressed, Hopeless 1  PHQ - 2 Score 1  Altered sleeping 3  Tired, decreased energy 3  Change in appetite 1  Feeling bad or failure about yourself  0  Trouble  concentrating 0  Moving slowly or fidgety/restless  1  Suicidal thoughts 0  PHQ-9 Score 9   GAD 7 : Generalized Anxiety Score 09/15/2019 06/09/2019  Nervous, Anxious, on Edge 3 3  Control/stop worrying 3 3  Worry too much - different things 3 3  Trouble relaxing 3 3  Restless 3 1  Easily annoyed or irritable 1 1  Afraid - awful might happen 0 1  Total GAD 7 Score 16 15  Anxiety Difficulty - Somewhat difficult

## 2019-09-15 ENCOUNTER — Telehealth: Payer: Self-pay | Admitting: Obstetrics

## 2019-09-15 ENCOUNTER — Other Ambulatory Visit: Payer: Self-pay | Admitting: Obstetrics

## 2019-09-15 ENCOUNTER — Other Ambulatory Visit: Payer: Self-pay

## 2019-09-15 ENCOUNTER — Telehealth (INDEPENDENT_AMBULATORY_CARE_PROVIDER_SITE_OTHER): Payer: Medicaid Other | Admitting: Clinical

## 2019-09-15 DIAGNOSIS — F411 Generalized anxiety disorder: Secondary | ICD-10-CM | POA: Diagnosis not present

## 2019-09-15 DIAGNOSIS — F419 Anxiety disorder, unspecified: Secondary | ICD-10-CM

## 2019-09-15 MED ORDER — ALPRAZOLAM 0.25 MG PO TABS: 0.2500 mg | ORAL_TABLET | Freq: Two times a day (BID) | ORAL | 2 refills | Status: DC

## 2019-09-15 NOTE — Telephone Encounter (Signed)
-----   Message from Garlan Fair, [place] sent at 09/15/2019  9:46 AM EDT ----- Pt needs one month refill Xanax so she has time to get established with psychiatry. She has been taking as prescribed for the past three months.

## 2019-09-15 NOTE — Patient Instructions (Addendum)
Behavioral Health Resources:   What if I or someone I know is in crisis?  . If you are thinking about harming yourself or having thoughts of suicide, or if you know someone who is, seek help right away.  . Call your doctor or mental health care provider.  . Call 911 or go to a hospital emergency room to get immediate help, or ask a friend or family member to help you do these things.  . Call the [place] National Suicide Prevention Lifeline's toll-free, 24-hour hotline at 1-800-273-TALK 734 556 2388) or TTY: 1-800-799-4 TTY 712 471 8359) to talk to a trained counselor.  . If you are in crisis, make sure you are not left alone.   . If someone else is in crisis, make sure he or she is not left alone   24 Hour :   [place] National Suicide Hotline: (386)497-5467  [facility] Mobile Crisis: [phone]   [facility]  [address] [place], [place] [address]  [phone] or [phone]  [facility] the [employer] (Domestic Violence, Rape & Victim Assistance)  [phone]  [facility]  [address], [place]  [address]   [phone] or [phone]   [facility] [facility]: [phone] (8am-4pm) or [phone] (after hours)           [facility], [address] [place], [place]  [phone] Fax: [phone] [email or web address]  *Interpreters available *Accepts Medicaid, Medicare, uninsured     Coping with Panic Attacks   What is a panic attack?  You may have had a panic attack if you experienced four or more of the symptoms listed below coming on abruptly and peaking in about 10 minutes.  Panic Symptoms   . Pounding heart  . Sweating  . Trembling or shaking  . Shortness of breath  . Feeling of choking  . Chest pain  . Nausea or abdominal distress    . Feeling dizzy, unsteady, lightheaded,  or faint  . Feelings of unreality or being detached from yourself  . Fear of losing control or going crazy  . Fear of dying  . Numbness or tingling  . Chills or hot flashes      Panic attacks are sometimes accompanied by avoidance of certain places or situations. These are often situations that would be difficult to escape from or in which help might not be available. Examples might include crowded shopping malls, public transportation, restaurants, or driving.   Why do panic attacks occur?   Panic attacks are the body's alarm system gone awry. All of [place] have a built-in alarm system, powered by adrenaline, which increases our heart rate, breathing, and blood flow in response to danger. Ordinarily, this 'danger response system' works well. In some people, however, the response is either out of proportion to whatever stress is going on, or may come out of the blue without any stress at all.   For example, if you are walking in the woods and see a bear coming your way, a variety of changes occur in your body to prepare you to either fight the danger or flee from the situation. Your heart rate will increase to get more blood flow around your body, your breathing rate will quicken so that more oxygen is available, and your muscles will tighten in order to be ready to fight or run. You may feel nauseated as blood flow leaves your stomach area and moves into your limbs.  These bodily changes are all essential to helping you survive the dangerous situation. After the danger has passed, your body functions will begin to go back to normal. This is because your body also has a system for "recovering" by bringing your body back down to a normal state when the danger is over.   As you can see, the emergency response system is adaptive when there is, in fact, a "true" or "real" danger (e.g., bear). However, sometimes people find that their emergency response system is triggered in "everyday" situations where there  really is no true physical danger (e.g., in a meeting, in the grocery store, while driving in normal traffic, etc.).   What triggers a panic attack?  Sometimes particularly stressful situations can trigger a panic attack. For example, an argument with your spouse or stressors at work can cause a stress response (activating the emergency response system) because you perceive it as threatening or overwhelming, even if there is no direct risk to your survival.  Sometimes panic attacks don't seem to be triggered by anything in particular- they may "come out of the blue". Somehow, the natural "fight or flight" emergency response system has gotten activated when there is no real danger. Why does the body go into "emergency mode" when there is no real danger?   Often, people with panic attacks are frightened or alarmed by the physical sensations of the emergency response system. First, unexpected physical sensations are experienced (tightness in your chest or some shortness of breath). This then leads to feeling fearful or alarmed by these symptoms ("Something's wrong!", "Am I having a heart attack?", "Am I going to faint?") The mind perceives that there is a danger even though no real danger exists. This, in turn, activates the emergency response system ("fight or flight"), leading to a "full blown" panic attack. In summary, panic attacks occur when we misinterpret physical symptoms as signs of impending death, craziness, loss of control, embarrassment, or fear of fear. Sometimes you may be aware of thoughts of danger that activate the emergency response system (for example, thinking "I'm having a heart attack" when you feel chest pressure or increased heart rate). At other times, however, you may not be aware of such thoughts. After several incidences of being afraid of physical sensations, anxiety and panic can occur in response to the initial sensations without conscious thoughts of danger. Instead, you just  feel afraid or alarmed. In other words, the panic or fear may seem to occur "automatically" without you consciously telling yourself anything.   After having had one or more panic attacks, you may also become more focused on what is going on inside your body. You may scan your body and be more vigilant about noticing any symptoms that might signal the start of a panic attack. This makes it easier for panic attacks to happen again because you pick up on sensations you might otherwise not have noticed, and misinterpret them as something dangerous. A panic attack may then result.      How do I cope with panic attacks?  An important part of overcoming panic attacks involves re-interpreting your body's physical reactions and teaching yourself ways to decrease the physical arousal. This can be done through practicing the cognitive and behavioral interventions below.   Research has found that over half of people who have panic attacks show some signs of hyperventilation or overbreathing. This can produce initial sensations that alarm you and lead to a panic attack. Overbreathing can also develop as  part of the panic attack and make the symptoms worse. When people hyperventilate, certain blood vessels in the body become narrower. In particular, the brain may get slightly less oxygen. This can lead to the symptoms of dizziness, confusion, and lightheadedness that often occur during panic attacks. Other parts of the body may also get a bit less oxygen, which may lead to numbness or tingling in the hands or feet or the sensation of cold, clammy hands. It also may lead the heart to pump harder. Although these symptoms may be frightening and feel unpleasant, it is important to remember that hyperventilating is not dangerous. However, you can help overcome the unpleasantness of overbreathing by practicing Breathing Retraining.   Practice this basic technique three times a day, every day:  . Inhale. With your  shoulders relaxed, inhale as slowly and deeply as you can while you count to six. If you can, use your diaphragm to fill your lungs with air.  . Hold. Keep the air in your lungs as you slowly count to four.  . Exhale. Slowly breath out as you count to six.  . Repeat. Do the inhale-hold-exhale cycle several times. Each time you do it, exhale for longer counts.  Like any new skill, Breathing Retraining requires practice. Try practicing this skill twice a day for several minutes. Initially, do not try this technique in specific situations or when you become frightened or have a panic attack. Begin by practicing in a quiet environment to build up your skill level so that you can later use it in time of "emergency."   2. Decreasing Avoidance  Regardless of whether you can identify why you began having panic attacks or whether they seemed to come out of the blue, the places where you began having panic attacks often can become triggers themselves. It is not uncommon for individuals to begin to avoid the places where they have had panic attacks. Over time, the individual may begin to avoid more and more places, thereby decreasing their activities and often negatively impacting their quality of life. To break the cycle of avoidance, it is important to first identify the places or situations that are being avoided, and then to do some "relearning."  To begin this intervention, first create a list of locations or situations that you tend to avoid. Then choose an avoided location or situation that you would like to target first. Now develop an "exposure hierarchy" for this situation or location. An "exposure hierarchy" is a list of actions that make you feel anxious in this situation. Order these actions from least to most anxiety-producing. It is often helpful to have the first item on your hierarchy involve thinking or imagining part of the feared/avoided situation.   Here is an example of an exposure hierarchy  for decreasing avoidance of the grocery store. Note how it is ordered from the least amount of anxiety (at the top) to the most anxiety (at the bottom):  Marland Kitchen Think about going to the grocery store alone.  . Go to the grocery store with a friend or family member.  . Go to the grocery store alone to pick up a few small items (5-10 minutes in the store).  . Shopping for 10-20 minutes in the store alone.  . Doing the shopping for the week by myself (20-30 minutes in the store).   Your homework is to "expose" yourself to the lowest item on your hierarchy and use your breathing relaxation and coping statements (see below) to help  you remain in the situation. Practice this several times during the upcoming week. Once you have mastered each item with minimal anxiety, move on to the next higher action on your list.   Cognitive Interventions  1. Identify your negative self-talk Anxious thoughts can increase anxiety symptoms and panic. The first step in changing anxious thinking is to identify your own negative, alarming self-talk. Some common alarming thoughts:  . I'm having a heart attack.            . I must be going crazy. . I think I'm dying. Marland Kitchen People will think I'm crazy. . I'm going to pass our.  . Oh no- here it comes.  . I can't stand this.  Peggye Form got to get out of here!  2. Use positive coping statements Changing or disrupting a pattern of anxious thoughts by replacing them with more calming or supportive statements can help to divert a panic attack. Some common helpful coping statements:  . This is not an emergency.  . I don't like feeling this way, but I can accept it.  . I can feel like this and still be okay.  . This has happened before, and I was okay. I'll be okay this time, too.  . I can be anxious and still deal with this situation.   /Emotional Wellbeing Apps and Websites Here are a few free apps meant to help you to help yourself.  To find, try searching on the internet to see  if the app is offered on Apple/Android devices. If your first choice doesn't come up on your device, the good news is that there are many choices! Play around with different apps to see which ones are helpful to you.    Calm This is an app meant to help increase calm feelings. Includes info, strategies, and tools for tracking your feelings.      Calm Harm  This app is meant to help with self-harm. Provides many 5-minute or 15-min coping strategies for doing instead of hurting yourself.       Healthy Minds Health Minds is a problem-solving tool to help deal with emotions and cope with stress you encounter wherever you are.      MindShift This app can help people cope with anxiety. Rather than trying to avoid anxiety, you can make an important shift and face it.      MY3  MY3 features a support system, safety plan and resources with the goal of offering a tool to use in a time of need.       My Life My Voice  This mood journal offers a simple solution for tracking your thoughts, feelings and moods. Animated emoticons can help identify your mood.       Relax Melodies Designed to help with sleep, on this app you can mix sounds and meditations for relaxation.      Smiling Mind Smiling Mind is meditation made easy: it's a simple tool that helps put a smile on your mind.        Stop, Breathe & Think  A friendly, simple guide for people through meditations for mindfulness and compassion.  Stop, Breathe and Think Kids Enter your current feelings and choose a "mission" to help you cope. Offers videos for certain moods instead of just sound recordings.       Team Orange The goal of this tool is to help teens change how they think, act, and react. This app helps you focus on your  own good feelings and experiences.      The [employer] Hope Box The [employer] Box (VHB) contains simple tools to help patients with coping, relaxation, distraction, and positive thinking.

## 2019-09-15 NOTE — Progress Notes (Signed)
Rx for Xanax sent to pharmacy.  I called patient to inform her that Rx is at her pharmacy for pick-up, and that she needs to keep appointment with psychiatry for continued management of her anxiety.

## 2019-09-16 NOTE — Telephone Encounter (Signed)
Attempted to call patient to inform of rx. No answer, unable to leave message due to vm box being full

## 2019-09-21 ENCOUNTER — Telehealth: Payer: Self-pay | Admitting: Family Medicine

## 2019-09-21 NOTE — BH Specialist Note (Signed)
Pt did not arrive to video visit and did not answer the phone; pt's voicemail is full, so unable to leave phone message; left MyChart message for patient.   [place] via Telemedicine Video Visit  09/21/2019 Debbie Wood [id number]  Debbie Wood

## 2019-09-21 NOTE — Telephone Encounter (Signed)
Called the patient to inform of the upcoming appointment. Received message we’re sorry the voicemail box is full and can not receive messages at this time. °

## 2019-09-22 ENCOUNTER — Other Ambulatory Visit: Payer: Self-pay

## 2019-09-22 ENCOUNTER — Ambulatory Visit: Payer: Medicaid Other | Admitting: Clinical

## 2019-09-22 DIAGNOSIS — Z91199 Patient's noncompliance with other medical treatment and regimen due to unspecified reason: Secondary | ICD-10-CM

## 2019-09-22 DIAGNOSIS — Z5329 Procedure and treatment not carried out because of patient's decision for other reasons: Secondary | ICD-10-CM

## 2019-12-08 ENCOUNTER — Ambulatory Visit (INDEPENDENT_AMBULATORY_CARE_PROVIDER_SITE_OTHER): Payer: Medicaid Other | Admitting: Psychiatry

## 2019-12-08 ENCOUNTER — Encounter (HOSPITAL_COMMUNITY): Payer: Self-pay | Admitting: Psychiatry

## 2019-12-08 ENCOUNTER — Other Ambulatory Visit: Payer: Self-pay

## 2019-12-08 DIAGNOSIS — F419 Anxiety disorder, unspecified: Secondary | ICD-10-CM

## 2019-12-08 DIAGNOSIS — F410 Panic disorder [episodic paroxysmal anxiety] without agoraphobia: Secondary | ICD-10-CM | POA: Insufficient documentation

## 2019-12-08 MED ORDER — FLUOXETINE HCL 20 MG PO CAPS: 20.0000 mg | ORAL_CAPSULE | Freq: Every day | ORAL | 0 refills | Status: DC

## 2019-12-08 MED ORDER — ALPRAZOLAM 0.25 MG PO TABS: 0.2500 mg | ORAL_TABLET | Freq: Two times a day (BID) | ORAL | 2 refills | Status: DC | PRN

## 2019-12-08 NOTE — Progress Notes (Signed)
Psychiatric Initial Adult Assessment   Patient Identification: Debbie Wood MRN:  [id number] Date of Evaluation:  12/08/2019 Referral Source: Coral Ceo MD Chief Complaint:   Chief Complaint    Panic Attack; Establish Care    Interview was conducted using WebEx teleconferencing application and I verified that I was speaking with the correct person using two identifiers. Only voice connection was established. I discussed the limitations of evaluation and management by telemedicine and  the availability of in person appointments. Patient expressed understanding and agreed to proceed. Visit Diagnosis:     ICD-10-CM   1. Panic disorder  F41.0   2. Anxiety  F41.9 ALPRAZolam (XANAX) 0.25 MG tablet    History of Present Illness:  Debbie Wood is a 29 yo SAAF who has been referred to [place] by her OBGYN Dr. Clearance Coots who has been prescribing her alprazolam 0.25 mg bid for panic anxiety since early last year. Patient reports no hx of panic anxiety, depression or any mental health issues until after she lost her first child in 04-12-2009. Her son died 11 days after delivery from what she was told herpes infection. She became depressed, started to have panic attacks and was admitted to behavioral unit at [facility]. She was discharged on an antidepressant (which she took for about 6 months) and clonazepam. Once panic attacks and depression resolved she stopped taking medications and was stable until she delivered her next child (son) in 2011/04/13. Within two weeks after delivery she started to have panic attacks - now she thinks she was anxious if her newborn will survive or die just like her first child did. Once again she was prescribed alprazolam and again once panic attacks stopped she stopped taking it few months later. In 04-12-16 she had her second son and the same scenario developed: started to have panic attacks, was prescribed alprazolam and after few months did not need it anymore. At that time  she also went through a period of psychotherapy.  In February 2019 she had her third child - a daughter. She developed eclampsia, anxiety increase and she was once more put on alprazolam 0.25 mg bid which she has stayed on since then. 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 Patient denies feeling depressed, hopeless, suicidal. She has no hx of mania, psychosis, alcohol or drug abuse. Despite taking alprazolam on a scheduled basis she still has episodic panic attacks and middle insomnia may reflect nighttime panic episodes. Panic attacks are not triggered by any clear circumstances/events rather appear when general level of stress is elevated (at work mostly). Her appetite is normal, there is no anhedonia, no problems with concentration.  She is not aware of any hx of mental health/alcohol or drug abuse problems in her biological family. She works full time at [employer], her three children live with her and her mother comes to help while she is at work.  Associated Signs/Symptoms: Depression Symptoms:  anxiety, panic attacks, disturbed sleep, (Hypo) Manic Symptoms:  None Anxiety Symptoms:  Panic Symptoms, Psychotic Symptoms:  None PTSD Symptoms: Had a traumatic exposure:  loss of newborn child in 04-12-09.  Past Psychiatric History: See above.  Previous Psychotropic Medications: Yes   Substance Abuse History in the last 12 months:  No.  Consequences of Substance Abuse: NA  Past Medical History:  Past Medical History:  Diagnosis Date  . Chest pain   . Gestational hypertension without significant  proteinuria during pregnancy in third trimester, antepartum 07/01/2016  . Heart murmur   . Hypertension affecting pregnancy in third trimester    does not have issues with it anymore  . Pregnancy induced hypertension     Past Surgical History:  Procedure Laterality Date  . INDUCED  ABORTION      Family Psychiatric History: None.  Family History:  Family History  Problem Relation Age of Onset  . Hypertension Mother   . Hyperlipidemia Mother   . Hypertension Father   . Heart attack Neg Hx     Social History:   Social History   Socioeconomic History  . Marital status: Single    Spouse name: Not on file  . Number of children: 3  . Years of education: Not on file  . Highest education level: Not on file  Occupational History  . Occupation: Engineer, technical sales  Tobacco Use  . Smoking status: Former Smoker    Types: Cigarettes    Quit date: 06/03/2009    Years since quitting: 10.5  . Smokeless tobacco: Never Used  Substance and Sexual Activity  . Alcohol use: No  . Drug use: No  . Sexual activity: Not Currently    Partners: Male    Birth control/protection: None  Other Topics Concern  . Not on file  Social History Narrative  . Not on file   Social Determinants of Health   Financial Resource Strain:   . Difficulty of Paying Living Expenses: Not on file  Food Insecurity:   . Worried About Programme researcher, broadcasting/film/video in the Last Year: Not on file  . Ran Out of Food in the Last Year: Not on file  Transportation Needs:   . Lack of Transportation (Medical): Not on file  . Lack of Transportation (Non-Medical): Not on file  Physical Activity:   . Days of Exercise per Week: Not on file  . Minutes of Exercise per Session: Not on file  Stress:   . Feeling of Stress : Not on file  Social Connections:   . Frequency of Communication with Friends and Family: Not on file  . Frequency of Social Gatherings with Friends and Family: Not on file  . Attends Religious Services: Not on file  . Active Member of Clubs or Organizations: Not on file  . Attends Banker Meetings: Not on file  . Marital Status: Not on file    Allergies:  No Known Allergies  Metabolic Disorder Labs: No results found for: HGBA1C, MPG No results found for: PROLACTIN No  results found for: CHOL, TRIG, HDL, CHOLHDL, VLDL, LDLCALC Lab Results  Component Value Date   TSH 0.456 12/23/2015    Therapeutic Level Labs: No results found for: LITHIUM No results found for: CBMZ No results found for: VALPROATE  Current Medications: Current Outpatient Medications  Medication Sig Dispense Refill  . ALPRAZolam (XANAX) 0.25 MG tablet Take 1 tablet (0.25 mg total) by mouth 2 (two) times daily as needed for anxiety or sleep. 60 tablet 2  . enalapril (VASOTEC) 2.5 MG tablet Take 1 tablet (2.5 mg total) by mouth daily for 7 days. 7 tablet 0  . ferrous sulfate 325 (65 FE) MG tablet Take 1 tablet (325 mg total) by mouth 2 (two) times daily with a meal. 60 tablet 5  . FLUoxetine (PROZAC) 20 MG capsule Take 1 capsule (20 mg total) by mouth daily. 30 capsule 0  . ibuprofen (ADVIL,MOTRIN) 600 MG tablet Take 1 tablet (600 mg total) by mouth  every 6 (six) hours. (Patient not taking: Reported on 02/06/2019) 60 tablet 0  . tinidazole (TINDAMAX) 500 MG tablet Take 2 tablets (1,000 mg total) by mouth daily with breakfast. 10 tablet 2   No current facility-administered medications for this visit.   Facility-Administered Medications Ordered in Other Visits  Medication Dose Route Frequency Provider Last Rate Last Admin  . lidocaine (PF) (XYLOCAINE) 1 % injection    Anesthesia Intra-op Karie Schwalbe, MD   11 mL at 01/30/19 20-Mar-2217     Psychiatric Specialty Exam: Review of Systems  Psychiatric/Behavioral: Positive for sleep disturbance. The patient is nervous/anxious.   All other systems reviewed and are negative.   unknown if currently breastfeeding.There is no height or weight on file to calculate BMI.  General Appearance: NA  Eye Contact:  NA  Speech:  Clear and Coherent and Normal Rate  Volume:  Normal  Mood:  Anxious  Affect:  NA  Thought Process:  Goal Directed and Linear  Orientation:  Full (Time, Place, and Person)  Thought Content:  Logical  Suicidal Thoughts:  No   Homicidal Thoughts:  No  Memory:  Immediate;   Good Recent;   Good Remote;   Good  Judgement:  Good  Insight:  Good  Psychomotor Activity:  NA  Concentration:  Concentration: Good  Recall:  Good  Fund of Knowledge:Good  Language: Good  Akathisia:  Negative  Handed:  Right  AIMS (if indicated):  not done  Assets:  Communication Skills Desire for Improvement Financial Resources/Insurance Housing Physical Health Social Support Talents/Skills  ADL's:  Intact  Cognition: WNL  Sleep:  Fair   Screenings: GAD-7     Video Visit from 09/15/2019 in [facility] Office Visit from [facility]  Total GAD-7 Score  16  15    PHQ2-9     Video Visit from 09/15/2019 in [facility] for [facility]  PHQ-2 Total Score  1  PHQ-9 Total Score  9      Assessment and Plan: 29 yo SAAF who has been referred to [place] by her OBGYN Dr. Clearance Coots who has been prescribing her alprazolam 0.25 mg bid for panic anxiety since early last year. Patient reports no hx of panic anxiety, depression or any mental health issues until after she lost her first child in 03-20-09. Her son died 11 days after delivery from what she was told herpes infection. She became depressed, started to have panic attacks and was admitted to behavioral unit at [facility]. She was discharged on an antidepressant (which she took for about 6 months) and clonazepam. Once panic attacks and depression resolved she stopped taking medications and was stable until she delivered her next child (son) in 2011/03/21. Within two weeks after delivery she started to have panic attacks - now she thinks she was anxious if her newborn will survive or die just like her first child did. Once again she was prescribed alprazolam and again once panic attacks stopped she stopped taking it few months later. In 20-Mar-2016 she had her second son and the same scenario developed: started to have  panic attacks, was prescribed alprazolam and after few months did not need it anymore. At that time she also went through a period of psychotherapy.  In February 2019 she had her third child - a daughter. She developed eclampsia, anxiety increase and she was once more put on alprazolam 0.25 mg bid which she has stayed on since then.  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 Patient denies feeling depressed, hopeless, suicidal. She has no hx of mania, psychosis, alcohol or drug abuse. Despite taking alprazolam on a scheduled basis she still has episodic panic attacks and middle insomnia may reflect nighttime panic episodes. Panic attacks are not triggered by any clear circumstances/events rather appear when general level of stress is elevated (at work mostly). Her appetite is normal, there is no anhedonia, no problems with concentration.  Dx: Panic disorder  Plan: Patient is willing to try again an antidepressant to help prevent panic attacks: we will add fluoxetine 20 mg daily. I will change alprazolam to 0.5 mg bid prn anxiety/insomnia. Next appointment in one month. The plan was discussed with patient who had an opportunity to ask questions and these were all answered. I spend 60 minutes in voice only contact with the patient and devoted approximately 50% of this time to explanation of diagnosis, discussion of treatment options and med education.    Stephanie Acre, MD 1/5/202111:29 AM

## 2019-12-21 ENCOUNTER — Ambulatory Visit: Payer: Medicaid Other | Admitting: Obstetrics

## 2020-01-05 ENCOUNTER — Other Ambulatory Visit: Payer: Self-pay

## 2020-01-05 ENCOUNTER — Ambulatory Visit (INDEPENDENT_AMBULATORY_CARE_PROVIDER_SITE_OTHER): Payer: Medicaid Other | Admitting: Psychiatry

## 2020-01-05 ENCOUNTER — Ambulatory Visit (HOSPITAL_COMMUNITY): Payer: Medicaid Other | Admitting: Psychiatry

## 2020-01-05 DIAGNOSIS — F410 Panic disorder [episodic paroxysmal anxiety] without agoraphobia: Secondary | ICD-10-CM | POA: Diagnosis not present

## 2020-01-05 MED ORDER — FLUOXETINE HCL 40 MG PO CAPS: 40.0000 mg | ORAL_CAPSULE | Freq: Every day | ORAL | 1 refills | Status: DC

## 2020-01-05 NOTE — Progress Notes (Signed)
BH MD/PA/NP OP Progress Note  01/05/2020 11:37 AM Debbie Wood  MRN:  [id number] Interview was conducted by phone and I verified that I was speaking with the correct person using two identifiers. I discussed the limitations of evaluation and management by telemedicine and  the availability of in person appointments. Patient expressed understanding and agreed to proceed.  Chief Complaint: Panic attacks.  HPI: 29 yo SAAF with no hx of panic anxiety, depression or any mental health issues until after she lost her first child in Mar 21, 2009. Her son died 11 days after delivery from what she was told herpes infection. She became depressed, started to have panic attacks and was admitted to behavioral unit at [facility]. She was discharged on an antidepressant (which she took for about 6 months) and clonazepam. Once panic attacks and depression resolved she stopped taking medications and was stable until she delivered her next child (son) in 22-Mar-2011. Within two weeks after delivery she started to have panic attacks - now she thinks she was anxious if her newborn will survive or die just like her first child did. Once again she was prescribed alprazolam and again once panic attacks stopped she stopped taking it few months later. In Mar 21, 2016 she had her second son and the same scenario developed: started to have panic attacks, was prescribed alprazolam and after few months did not need it anymore. At that time she also went through a period of psychotherapy.  In February 2019 she had her third child - a daughter. She developed eclampsia, anxiety increase and she was once more put on alprazolam 0.25 mg bid which she has stayed on since then. 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 Patient denies feeling depressed, hopeless, suicidal. She has no hx of mania, psychosis, alcohol or drug abuse.  Despite taking alprazolam on a scheduled basis she still has episodic panic attacks and middle insomnia may reflect nighttime panic episodes. Panic attacks are not triggered by any clear circumstances/events rather appear when general level of stress is elevated (at work mostly). Her appetite is normal, there is no anhedonia, no problems with concentration. We have continued alprazolam 0.5 mg prn and started fluoxetine 20 mg daily. She tolerates the latter well and noticed that her panic attacks are less intense and shorted albeit as frequent as before.  Visit Diagnosis:    ICD-10-CM   1. Panic disorder  F41.0     Past Psychiatric History: Please see intake H&P.  Past Medical History:  Past Medical History:  Diagnosis Date  . Chest pain   . Gestational hypertension without significant proteinuria during pregnancy in third trimester, antepartum 07/01/2016  . Heart murmur   . Hypertension affecting pregnancy in third trimester    does not have issues with it anymore  . Pregnancy induced hypertension     Past Surgical History:  Procedure Laterality Date  . INDUCED ABORTION      Family Psychiatric History: None.  Family History:  Family History  Problem Relation Age of Onset  . Hypertension Mother   . Hyperlipidemia Mother   . Hypertension Father   . Heart attack Neg Hx     Social History:  Social History   Socioeconomic History  . Marital status: Single    Spouse name: Not on file  . Number of children: 3  . Years of education: Not on file  . Highest education level: Not on  file  Occupational History  . Occupation: Engineer, technical sales  Tobacco Use  . Smoking status: Former Smoker    Types: Cigarettes    Quit date: 06/03/2009    Years since quitting: 10.5  . Smokeless tobacco: Never Used  Substance and Sexual Activity  . Alcohol use: No  . Drug use: No  . Sexual activity: Not Currently    Partners: Male    Birth control/protection: None  Other Topics Concern   . Not on file  Social History Narrative  . Not on file   Social Determinants of Health   Financial Resource Strain:   . Difficulty of Paying Living Expenses: Not on file  Food Insecurity:   . Worried About Programme researcher, broadcasting/film/video in the Last Year: Not on file  . Ran Out of Food in the Last Year: Not on file  Transportation Needs:   . Lack of Transportation (Medical): Not on file  . Lack of Transportation (Non-Medical): Not on file  Physical Activity:   . Days of Exercise per Week: Not on file  . Minutes of Exercise per Session: Not on file  Stress:   . Feeling of Stress : Not on file  Social Connections:   . Frequency of Communication with Friends and Family: Not on file  . Frequency of Social Gatherings with Friends and Family: Not on file  . Attends Religious Services: Not on file  . Active Member of Clubs or Organizations: Not on file  . Attends Banker Meetings: Not on file  . Marital Status: Not on file    Allergies: No Known Allergies  Metabolic Disorder Labs: No results found for: HGBA1C, MPG No results found for: PROLACTIN No results found for: CHOL, TRIG, HDL, CHOLHDL, VLDL, LDLCALC Lab Results  Component Value Date   TSH 0.456 12/23/2015   TSH 2.073 06/03/2013    Therapeutic Level Labs: No results found for: LITHIUM No results found for: VALPROATE No components found for:  CBMZ  Current Medications: Current Outpatient Medications  Medication Sig Dispense Refill  . ALPRAZolam (XANAX) 0.25 MG tablet Take 1 tablet (0.25 mg total) by mouth 2 (two) times daily as needed for anxiety or sleep. 60 tablet 2  . enalapril (VASOTEC) 2.5 MG tablet Take 1 tablet (2.5 mg total) by mouth daily for 7 days. 7 tablet 0  . ferrous sulfate 325 (65 FE) MG tablet Take 1 tablet (325 mg total) by mouth 2 (two) times daily with a meal. 60 tablet 5  . FLUoxetine (PROZAC) 40 MG capsule Take 1 capsule (40 mg total) by mouth daily. 30 capsule 1  . ibuprofen (ADVIL,MOTRIN)  600 MG tablet Take 1 tablet (600 mg total) by mouth every 6 (six) hours. (Patient not taking: Reported on 02/06/2019) 60 tablet 0  . tinidazole (TINDAMAX) 500 MG tablet Take 2 tablets (1,000 mg total) by mouth daily with breakfast. 10 tablet 2   No current facility-administered medications for this visit.   Facility-Administered Medications Ordered in Other Visits  Medication Dose Route Frequency Provider Last Rate Last Admin  . lidocaine (PF) (XYLOCAINE) 1 % injection    Anesthesia Intra-op Karie Schwalbe, MD   11 mL at 01/30/19 2218     Psychiatric Specialty Exam: Review of Systems  Psychiatric/Behavioral: The patient is nervous/anxious.   All other systems reviewed and are negative.   unknown if currently breastfeeding.There is no height or weight on file to calculate BMI.  General Appearance: NA  Eye Contact:  NA  Speech:  Clear and Coherent and Normal Rate  Volume:  Normal  Mood:  Episodic anxiety  Affect:  NA  Thought Process:  Goal Directed and Linear  Orientation:  Full (Time, Place, and Person)  Thought Content: Logical   Suicidal Thoughts:  No  Homicidal Thoughts:  No  Memory:  Immediate;   Good Recent;   Good Remote;   Good  Judgement:  Good  Insight:  Good  Psychomotor Activity:  NA  Concentration:  Concentration: Good  Recall:  Good  Fund of Knowledge: Good  Language: Good  Akathisia:  Negative  Handed:  Right  AIMS (if indicated): not done  Assets:  Communication Skills Desire for Improvement Financial Resources/Insurance Housing Physical Health Resilience Talents/Skills  ADL's:  Intact  Cognition: WNL  Sleep:  Fair   Screenings: GAD-7     Video Visit from 09/15/2019 in [facility] Office Visit from [facility]09/2019 [facility]  Total GAD-7 Score  16  15    PHQ2-9     Video Visit from 09/15/2019 in Center for [facility]  PHQ-2 Total Score  1  PHQ-9 Total Score  9        Assessment and Plan: 29 yo SAAF with no hx of panic anxiety, depression or any mental health issues until after she lost her first child in 04/05/2009. Her son died 11 days after delivery from what she was told herpes infection. She became depressed, started to have panic attacks and was admitted to behavioral unit at [facility]. She was discharged on an antidepressant (which she took for about 6 months) and clonazepam. Once panic attacks and depression resolved she stopped taking medications and was stable until she delivered her next child (son) in 06-Apr-2011. Within two weeks after delivery she started to have panic attacks - now she thinks she was anxious if her newborn will survive or die just like her first child did. Once again she was prescribed alprazolam and again once panic attacks stopped she stopped taking it few months later. In 2016/04/05 she had her second son and the same scenario developed: started to have panic attacks, was prescribed alprazolam and after few months did not need it anymore. At that time she also went through a period of psychotherapy.  In February 2019 she had her third child - a daughter. She developed eclampsia, anxiety increase and she was once more put on alprazolam 0.25 mg bid which she has stayed on since then. 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 Patient denies feeling depressed, hopeless, suicidal. She has no hx of mania, psychosis, alcohol or drug abuse. Despite taking alprazolam on a scheduled basis she still has episodic panic attacks and middle insomnia may reflect nighttime panic episodes. Panic attacks are not triggered by any clear circumstances/events rather appear when general level of stress is elevated (at work mostly). Her appetite is normal, there is no anhedonia, no problems with concentration. We have continued alprazolam 0.5 mg prn  and started fluoxetine 20 mg daily. She tolerates the latter well and noticed that her panic attacks are less intense and shorted albeit as frequent as before.  Dx: Panic disorder  Plan: WE will increase dose of fluoxetine to 40 mg and continue alprazolam to 0.5 mg bid prn anxiety/insomnia. Next appointment in two months. The plan was discussed with patient who had an opportunity to ask questions  and these were all answered. I spend 25 minutes in phone contact with the patient.     Stephanie Acre, MD 01/05/2020, 11:37 AM

## 2020-03-04 ENCOUNTER — Ambulatory Visit (INDEPENDENT_AMBULATORY_CARE_PROVIDER_SITE_OTHER): Payer: Medicaid Other | Admitting: Psychiatry

## 2020-03-04 ENCOUNTER — Other Ambulatory Visit: Payer: Self-pay

## 2020-03-04 DIAGNOSIS — F419 Anxiety disorder, unspecified: Secondary | ICD-10-CM | POA: Diagnosis not present

## 2020-03-04 DIAGNOSIS — F410 Panic disorder [episodic paroxysmal anxiety] without agoraphobia: Secondary | ICD-10-CM | POA: Diagnosis not present

## 2020-03-04 MED ORDER — ALPRAZOLAM 0.25 MG PO TABS: 0.2500 mg | ORAL_TABLET | Freq: Three times a day (TID) | ORAL | 2 refills | Status: DC | PRN

## 2020-03-04 MED ORDER — FLUOXETINE HCL 40 MG PO CAPS: 40.0000 mg | ORAL_CAPSULE | Freq: Every day | ORAL | 0 refills | Status: DC

## 2020-03-04 NOTE — Progress Notes (Signed)
BH MD/PA/NP OP Progress Note  03/04/2020 10:10 AM Debbie Wood  MRN:  [id number] Interview was conducted by phone and I verified that I was speaking with the correct person using two identifiers. I discussed the limitations of evaluation and management by telemedicine and  the availability of in person appointments. Patient expressed understanding and agreed to proceed.  Chief Complaint: Anxiety.  HPI: 29 yo SAAF with no hx of panic anxiety, depression or any mental health issues until after she lost her first child in 03-Apr-2009. Her son died 11 days after delivery from what she was told herpes infection. She became depressed, started to have panic attacks and was admitted to behavioral unit at [facility]. She was discharged on an antidepressant (which she took for about 6 months) and clonazepam. Once panic attacks and depression resolved she stopped taking medications and was stable until she delivered her next child (son) in 04/04/2011. Within two weeks after delivery she started to have panic attacks - now she thinks she was anxious if her newborn will survive or die just like her first child did. Once again she was prescribed alprazolam and again once panic attacks stopped she stopped taking it few months later. In 04/03/2016 she had her second son and the same scenario developed: started to have panic attacks, was prescribed alprazolam and after few months did not need it anymore. At that time she also went through a period of psychotherapy. In February 2019 she had her third child - a daughter. She developed eclampsia, anxiety increase and she was once more put on alprazolam 0.25 mg bid which she has stayed on since then. 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 Patient denies feeling depressed, hopeless, suicidal. She has no hx of mania, psychosis, alcohol or drug abuse. Despite  taking alprazolam on a scheduled basis she still has episodic panic attacks and middle insomnia may reflect nighttime panic episodes. Panic attacks are not triggered by any clear circumstances/events rather appear when general level of stress is elevated (at work mostly). Her appetite is normal, there is no anhedonia, no problems with concentration. We have continued alprazolam 0.5 mg prn and started fluoxetine 20 mg daily. We then increased it to 40 mg and  she noticed that her panic attacks are less intense and short lived..    Visit Diagnosis:    ICD-10-CM   1. Panic disorder  F41.0   2. Anxiety  F41.9 ALPRAZolam (XANAX) 0.25 MG tablet    Past Psychiatric History: Please see intake H&P.  Past Medical History:  Past Medical History:  Diagnosis Date  . Chest pain   . Gestational hypertension without significant proteinuria during pregnancy in third trimester, antepartum 07/01/2016  . Heart murmur   . Hypertension affecting pregnancy in third trimester    does not have issues with it anymore  . Pregnancy induced hypertension     Past Surgical History:  Procedure Laterality Date  . INDUCED ABORTION      Family Psychiatric History: None.  Family History:  Family History  Problem Relation Age of Onset  . Hypertension Mother   . Hyperlipidemia Mother   . Hypertension Father   . Heart attack Neg Hx     Social History:  Social History   Socioeconomic History  . Marital status: Single    Spouse name: Not on file  . Number of children: 3  . Years of education:  Not on file  . Highest education level: Not on file  Occupational History  . Occupation: Engineer, technical sales  Tobacco Use  . Smoking status: Former Smoker    Types: Cigarettes    Quit date: 06/03/2009    Years since quitting: 10.7  . Smokeless tobacco: Never Used  Substance and Sexual Activity  . Alcohol use: No  . Drug use: No  . Sexual activity: Not Currently    Partners: Male    Birth  control/protection: None  Other Topics Concern  . Not on file  Social History Narrative  . Not on file   Social Determinants of Health   Financial Resource Strain:   . Difficulty of Paying Living Expenses:   Food Insecurity:   . Worried About Programme researcher, broadcasting/film/video in the Last Year:   . Barista in the Last Year:   Transportation Needs:   . Freight forwarder (Medical):   Marland Kitchen Lack of Transportation (Non-Medical):   Physical Activity:   . Days of Exercise per Week:   . Minutes of Exercise per Session:   Stress:   . Feeling of Stress :   Social Connections:   . Frequency of Communication with Friends and Family:   . Frequency of Social Gatherings with Friends and Family:   . Attends Religious Services:   . Active Member of Clubs or Organizations:   . Attends Banker Meetings:   Marland Kitchen Marital Status:     Allergies: No Known Allergies  Metabolic Disorder Labs: No results found for: HGBA1C, MPG No results found for: PROLACTIN No results found for: CHOL, TRIG, HDL, CHOLHDL, VLDL, LDLCALC Lab Results  Component Value Date   TSH 0.456 12/23/2015   TSH 2.073 06/03/2013    Therapeutic Level Labs: No results found for: LITHIUM No results found for: VALPROATE No components found for:  CBMZ  Current Medications: Current Outpatient Medications  Medication Sig Dispense Refill  . ALPRAZolam (XANAX) 0.25 MG tablet Take 1 tablet (0.25 mg total) by mouth 3 (three) times daily as needed for anxiety or sleep. 90 tablet 2  . enalapril (VASOTEC) 2.5 MG tablet Take 1 tablet (2.5 mg total) by mouth daily for 7 days. 7 tablet 0  . ferrous sulfate 325 (65 FE) MG tablet Take 1 tablet (325 mg total) by mouth 2 (two) times daily with a meal. 60 tablet 5  . FLUoxetine (PROZAC) 40 MG capsule Take 1 capsule (40 mg total) by mouth daily. 90 capsule 0  . ibuprofen (ADVIL,MOTRIN) 600 MG tablet Take 1 tablet (600 mg total) by mouth every 6 (six) hours. (Patient not taking: Reported on  02/06/2019) 60 tablet 0  . tinidazole (TINDAMAX) 500 MG tablet Take 2 tablets (1,000 mg total) by mouth daily with breakfast. 10 tablet 2   No current facility-administered medications for this visit.   Facility-Administered Medications Ordered in Other Visits  Medication Dose Route Frequency Provider Last Rate Last Admin  . lidocaine (PF) (XYLOCAINE) 1 % injection    Anesthesia Intra-op Karie Schwalbe, MD   11 mL at 01/30/19 2218     Psychiatric Specialty Exam: Review of Systems  Psychiatric/Behavioral: The patient is nervous/anxious.   All other systems reviewed and are negative.   unknown if currently breastfeeding.There is no height or weight on file to calculate BMI.  General Appearance: NA  Eye Contact:  NA  Speech:  Clear and Coherent and Normal Rate  Volume:  Normal  Mood:  Anxious  Affect:  NA  Thought Process:  Goal Directed and Linear  Orientation:  Full (Time, Place, and Person)  Thought Content: Logical   Suicidal Thoughts:  No  Homicidal Thoughts:  No  Memory:  Immediate;   Good Recent;   Good Remote;   Good  Judgement:  Good  Insight:  Good  Psychomotor Activity:  NA  Concentration:  Concentration: Good  Recall:  Good  Fund of Knowledge: Good  Language: Good  Akathisia:  Negative  Handed:  Right  AIMS (if indicated): not done  Assets:  Communication Skills Desire for Improvement Financial Resources/Insurance Housing Physical Health Talents/Skills  ADL's:  Intact  Cognition: WNL  Sleep:  Fair   Screenings: GAD-7     Video Visit from 09/15/2019 in [place] for [facility] Office Visit from 06/09/2019 in [place]  Total GAD-7 Score  16  15    PHQ2-9     Video Visit from 09/15/2019 in [facility] for [facility]  PHQ-2 Total Score  1  PHQ-9 Total Score  9       Assessment and Plan:  29 yo SAAF with no hx of panic anxiety, depression or any mental health issues until after she lost her first  child in 2009-03-29. Her son died 11 days after delivery from what she was told herpes infection. She became depressed, started to have panic attacks and was admitted to behavioral unit at [facility]. She was discharged on an antidepressant (which she took for about 6 months) and clonazepam. Once panic attacks and depression resolved she stopped taking medications and was stable until she delivered her next child (son) in March 30, 2011. Within two weeks after delivery she started to have panic attacks - now she thinks she was anxious if her newborn will survive or die just like her first child did. Once again she was prescribed alprazolam and again once panic attacks stopped she stopped taking it few months later. In 03-29-16 she had her second son and the same scenario developed: started to have panic attacks, was prescribed alprazolam and after few months did not need it anymore. At that time she also went through a period of psychotherapy. In February 2019 she had her third child - a daughter. She developed eclampsia, anxiety increase and she was once more put on alprazolam 0.25 mg bid which she has stayed on since then. 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 Patient denies feeling depressed, hopeless, suicidal. She has no hx of mania, psychosis, alcohol or drug abuse. Despite taking alprazolam on a scheduled basis she still has episodic panic attacks and middle insomnia may reflect nighttime panic episodes. Panic attacks are not triggered by any clear circumstances/events rather appear when general level of stress is elevated (at work mostly). Her appetite is normal, there is no anhedonia, no problems with concentration. We have continued alprazolam 0.5 mg prn and started fluoxetine 20 mg daily. We then increased it to 40 mg and  she noticed that her panic attacks are less intense and short  lived.Marland Kitchen  Dx: Panic disorder  Plan: Continue fluoxetine to 40 mg and alprazolam to 0.25 mg tid prn anxiety/insomnia. Next appointment in a month.The plan was discussed with patient who had an opportunity to ask questions and these were all answered. I spend31minutes in phone contactwith the patient.   Stephanie Acre, MD 03/04/2020, 10:10 AM

## 2020-03-08 ENCOUNTER — Other Ambulatory Visit (HOSPITAL_COMMUNITY): Payer: Self-pay | Admitting: Psychiatry

## 2020-03-08 ENCOUNTER — Other Ambulatory Visit (HOSPITAL_COMMUNITY): Payer: Self-pay

## 2020-03-08 DIAGNOSIS — F419 Anxiety disorder, unspecified: Secondary | ICD-10-CM

## 2020-03-08 MED ORDER — ALPRAZOLAM 0.25 MG PO TABS: 0.2500 mg | ORAL_TABLET | Freq: Three times a day (TID) | ORAL | 2 refills | Status: AC | PRN

## 2020-06-04 ENCOUNTER — Encounter (HOSPITAL_COMMUNITY): Payer: Self-pay | Admitting: Emergency Medicine

## 2020-06-04 ENCOUNTER — Emergency Department (HOSPITAL_COMMUNITY)
Admission: EM | Admit: 2020-06-04 | Discharge: 2020-06-04 | Disposition: A | Discharge status: Home / Self Care | Payer: Medicaid Other | Attending: Emergency Medicine | Admitting: Emergency Medicine

## 2020-06-04 ENCOUNTER — Emergency Department (HOSPITAL_COMMUNITY): Payer: Medicaid Other

## 2020-06-04 DIAGNOSIS — Y999 Unspecified external cause status: Secondary | ICD-10-CM | POA: Diagnosis not present

## 2020-06-04 DIAGNOSIS — Y9389 Activity, other specified: Secondary | ICD-10-CM | POA: Insufficient documentation

## 2020-06-04 DIAGNOSIS — M25511 Pain in right shoulder: Secondary | ICD-10-CM | POA: Insufficient documentation

## 2020-06-04 DIAGNOSIS — Z79899 Other long term (current) drug therapy: Secondary | ICD-10-CM | POA: Diagnosis not present

## 2020-06-04 DIAGNOSIS — Z87891 Personal history of nicotine dependence: Secondary | ICD-10-CM | POA: Insufficient documentation

## 2020-06-04 DIAGNOSIS — W19XXXA Unspecified fall, initial encounter: Secondary | ICD-10-CM

## 2020-06-04 DIAGNOSIS — W01198A Fall on same level from slipping, tripping and stumbling with subsequent striking against other object, initial encounter: Secondary | ICD-10-CM | POA: Diagnosis not present

## 2020-06-04 DIAGNOSIS — Y92009 Unspecified place in unspecified non-institutional (private) residence as the place of occurrence of the external cause: Secondary | ICD-10-CM | POA: Insufficient documentation

## 2020-06-04 NOTE — ED Notes (Signed)
Patient given discharge instructions. Questions were answered. Patient verbalized understanding of discharge instructions and care at home.  

## 2020-06-04 NOTE — Discharge Instructions (Signed)
You have been seen here for shoulder pain.  I recommend that you alternate between taking ibuprofen and Tylenol every 6 hours for pain.  For example you can take Tylenol wait 6 hours then take ibuprofen wait another 6 hours and then repeat.  Please follow dosage and on back of bottle.  You may also apply ice to the area as this will help with inflammation and swelling please do not place ice on bare skin as this can cause a burn.  I recommend that you follow-up with orthopedic for further evaluation management of your shoulder pain.  I have also given you community health and wellness they work with individuals who have little to no insurance and can provide you with a primary care provider.  I want to come back to the emergency department if you develop chest pain, shortness of breath, uncontrolled nausea, vomiting, diarrhea, numbness or tingling in your arm, your arm becomes discolored as the symptoms require further evaluation and management.

## 2020-06-04 NOTE — ED Provider Notes (Signed)
While she was plan [facility] EMERGENCY DEPARTMENT Provider Note   CSN: [id number] Arrival date & time: 06/04/20  0440     History Chief Complaint  Patient presents with  . Fall    Debbie Wood is a 29 y.o. female.  HPI   Patient to the emergency department with chief complaint of right shoulder pain that started last night.  Patient states while she was playing with her child she tripped over a toy and fell into the wall hitting her right shoulder.  Patient admitts she has constant pain in her right shoulder, decreased range of motion, denies numbness or tingling in her arm.  She denies hitting her head, losing consciousness, denies being on any blood thinners.  Patient took over-the-counter ibuprofen Tylenol without relief admits that moving it makes the pain worse.  Patient has significant medical history of hypertension.  Patient denies headache, fever, chills, shortness of breath, chest pain, nausea, vomiting, abdominal pain, pedal edema.  Past Medical History:  Diagnosis Date  . Chest pain   . Gestational hypertension without significant proteinuria during pregnancy in third trimester, antepartum 07/01/2016  . Heart murmur   . Hypertension affecting pregnancy in third trimester    does not have issues with it anymore  . Pregnancy induced hypertension     Patient Active Problem List   Diagnosis Date Noted  . Panic disorder 12/08/2019  . Preeclampsia 01/30/2019  . Supervision of other normal pregnancy, antepartum 10/20/2018  . History of neonatal death 09-25-18  . Encounter for supervision of other normal pregnancy in first trimester 12/23/2015  . Heart murmur     Past Surgical History:  Procedure Laterality Date  . INDUCED ABORTION       OB History    Gravida  5   Para  4   Term  4   Preterm  0   AB  1   Living  3     SAB  0   TAB  1   Ectopic  0   Multiple  0   Live Births  4        Obstetric Comments  Pt induced          Family History  Problem Relation Age of Onset  . Hypertension Mother   . Hyperlipidemia Mother   . Hypertension Father   . Heart attack Neg Hx     Social History   Tobacco Use  . Smoking status: Former Smoker    Types: Cigarettes    Quit date: 06/03/2009    Years since quitting: 11.0  . Smokeless tobacco: Never Used  Vaping Use  . Vaping Use: Never used  Substance Use Topics  . Alcohol use: No  . Drug use: No    Home Medications Prior to Admission medications   Medication Sig Start Date End Date Taking? Authorizing Provider  ALPRAZolam (XANAX) 0.25 MG tablet Take 1 tablet (0.25 mg total) by mouth 3 (three) times daily as needed for anxiety or sleep. 03/08/20 06/06/20  Pucilowski, Roosvelt Maser, MD  enalapril (VASOTEC) 2.5 MG tablet Take 1 tablet (2.5 mg total) by mouth daily for 7 days. 02/02/19 02/09/19  Mullis, Kiersten P, DO  ferrous sulfate 325 (65 FE) MG tablet Take 1 tablet (325 mg total) by mouth 2 (two) times daily with a meal. 01/05/19   Brock Bad, MD  FLUoxetine (PROZAC) 40 MG capsule Take 1 capsule (40 mg total) by mouth daily. 03/04/20 06/02/20  Pucilowski, Roosvelt Maser,  MD  ibuprofen (ADVIL,MOTRIN) 600 MG tablet Take 1 tablet (600 mg total) by mouth every 6 (six) hours. Patient not taking: Reported on 02/06/2019 02/02/19   Orpah Cobb P, DO  tinidazole (TINDAMAX) 500 MG tablet Take 2 tablets (1,000 mg total) by mouth daily with breakfast. 06/10/19   Brock Bad, MD    Allergies    Patient has no known allergies.  Review of Systems   Review of Systems  Constitutional: Negative for chills and fever.  HENT: Negative for congestion, sneezing and sore throat.   Eyes: Negative for pain.  Respiratory: Negative for cough and shortness of breath.   Cardiovascular: Negative for chest pain, palpitations and leg swelling.  Gastrointestinal: Negative for abdominal pain, diarrhea, nausea and vomiting.  Genitourinary: Negative for enuresis and flank pain.    Musculoskeletal: Negative for back pain.       Admits to right shoulder pain.  Skin: Negative for rash.  Neurological: Negative for dizziness, light-headedness and headaches.  Hematological: Does not bruise/bleed easily.    Physical Exam Updated Vital Signs BP (!) 156/108   Pulse 68   Temp 98.6 F (37 C) (Oral)   Resp 18   LMP 05/05/2020 (Approximate)   SpO2 100%   Physical Exam Vitals and nursing note reviewed.  Constitutional:      General: She is not in acute distress.    Appearance: She is not ill-appearing.  HENT:     Head: Normocephalic and atraumatic.     Nose: No congestion.     Mouth/Throat:     Mouth: Mucous membranes are moist.     Pharynx: Oropharynx is clear.  Eyes:     General: No scleral icterus. Cardiovascular:     Rate and Rhythm: Normal rate and regular rhythm.     Pulses: Normal pulses.     Heart sounds: No murmur heard.  No friction rub. No gallop.   Pulmonary:     Effort: No respiratory distress.     Breath sounds: No wheezing, rhonchi or rales.  Abdominal:     General: There is no distension.     Tenderness: There is no abdominal tenderness. There is no guarding.  Musculoskeletal:        General: No swelling.     Comments: Patient's right shoulder was visualized, no edema, ecchymosis, lacerations, abrasions or other gross abnormalities noted.  Patient had decrease range of motion, 4/5 strength due to pain, strong radial pulse, good capillary refill, sensory fully intact denying paresthesias.  Skin:    General: Skin is warm and dry.     Findings: No rash.  Neurological:     Mental Status: She is alert.  Psychiatric:        Mood and Affect: Mood normal.     ED Results / Procedures / Treatments   Labs (all labs ordered are listed, but only abnormal results are displayed) Labs Reviewed - No data to display  EKG None  Radiology DG Shoulder Right  Result Date: 06/04/2020 CLINICAL DATA:  29 year old female with history of pain in the  right shoulder after falling yesterday evening. EXAM: RIGHT SHOULDER - 2+ VIEW COMPARISON:  No priors. FINDINGS: There is no evidence of fracture or dislocation. There is no evidence of arthropathy or other focal bone abnormality. Soft tissues are unremarkable. IMPRESSION: Negative. Electronically Signed   By: Trudie Reed M.D.   On: 06/04/2020 06:36    Procedures Procedures (including critical care time)  Medications Ordered in ED Medications - No data  to display  ED Course  I have reviewed the triage vital signs and the nursing notes.  Pertinent labs & imaging results that were available during my care of the patient were reviewed by me and considered in my medical decision making (see chart for details).    MDM Rules/Calculators/A&P                          I have personally reviewed all imaging, labs and have interpreted them.  Most concern for septic arthritis versus fractures versus compartment syndrome.  Unlikely patient suffering from a fracture as x-ray of shoulder did not show  acute abnormalities, no fractures or dislocations noted.  Unlikely patient suffering from compartment syndrome as neurovascular was fully intact, good radial pulses, good capillary refill, area was soft to the touch.  Unlikely patient suffering from septic arthritis as she denies IV drug use, skin exam did not show track marks, joint was nonerythematous, no rashes noted, not warm to the touch.  Patient was nontoxic-appearing, vital signs remained stable labs were not indicated.  Appears resting comfortably, showing no acute signs distress.  Vital signs have remained stable does not meet criteria to be admitted to the hospital.  Likely patient is suffering from possible muscle strain versus ligament versus tendon damage, recommend patient follows up with orthopedic for further evaluation and management.  Patient was discussed with attending who agrees with assessment and plan.  Patient was given at home care  as well as strict return precautions.  Patient verbalized that she understood and agreed with said plan. Final Clinical Impression(s) / ED Diagnoses Final diagnoses:  Fall, initial encounter  Acute pain of right shoulder    Rx / DC Orders ED Discharge Orders    None       Carroll Sage, PA-C 06/04/20 0856    Melene Plan, DO 06/04/20 1427

## 2020-06-04 NOTE — ED Triage Notes (Signed)
Pt states around 8 last night she tripped getting out of her daughters pack and play and fell against the wall with her R shoulder. States she took some tylenol but woke up this morning in severe pain, unable to move her shoulder.

## 2020-07-05 ENCOUNTER — Telehealth (INDEPENDENT_AMBULATORY_CARE_PROVIDER_SITE_OTHER): Payer: Medicaid Other | Admitting: Psychiatry

## 2020-07-05 ENCOUNTER — Other Ambulatory Visit: Payer: Self-pay

## 2020-07-05 DIAGNOSIS — F410 Panic disorder [episodic paroxysmal anxiety] without agoraphobia: Secondary | ICD-10-CM

## 2020-07-05 MED ORDER — ALPRAZOLAM 0.25 MG PO TABS: 0.2500 mg | ORAL_TABLET | Freq: Two times a day (BID) | ORAL | 3 refills | Status: DC | PRN

## 2020-07-05 NOTE — Progress Notes (Signed)
BH MD/PA/NP OP Progress Note  07/05/2020 8:39 AM Debbie Wood  MRN:  [id number] Interview was conducted by phone and I verified that I was speaking with the correct person using two identifiers. I discussed the limitations of evaluation and management by telemedicine and  the availability of in person appointments. Patient expressed understanding and agreed to proceed. Patient location - home; physician - home office.  Chief Complaint: "I have been needing less Xanax".  HPI: 29 yo SAAFwithno hx of panic anxiety, depression or any mental health issues until after she lost her first child in 2009-03-27. Her son died 11 days after delivery from what she was told herpes infection. She became depressed, started to have panic attacks and was admitted to behavioral unit at [facility]. She was discharged on an antidepressant (which she took for about 6 months) and clonazepam. Once panic attacks and depression resolved she stopped taking medications and was stable until she delivered her next child (son) in Mar 28, 2011. Within two weeks after delivery she started to have panic attacks - now she thinks she was anxious if her newborn will survive or die just like her first child did. Once again she was prescribed alprazolam and again once panic attacks stopped she stopped taking it few months later. In March 27, 2016 she had her second son and the same scenario developed: started to have panic attacks, was prescribed alprazolam and after few months did not need it anymore. At that time she also went through a period of psychotherapy. In February 2019 she had her third child - a daughter. She developed eclampsia, anxiety increase and she was once more put on alprazolam 0.25 mg bid which she has stayed on since then. 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 Patient denies feeling depressed, hopeless,  suicidal. She has no hx of mania, psychosis, alcohol or drug abuse. Despite taking alprazolam on a scheduled basis she still has episodic panic attacks and middle insomnia may reflect nighttime panic episodes. Panic attacks are not triggered by any clear circumstances/events rather appear when general level of stress is elevated (at work mostly). We have continued alprazolam 0.25 mg prn and started fluoxetine 20 mg daily then increased to 40 mg. She has run out of fluoxetine about a month ago and did not notice increase in anxiety. She now needs to take alprazolam 1-2 times a day to keep control of panic disorder.   Visit Diagnosis:    ICD-10-CM   1. Panic disorder  F41.0     Past Psychiatric History: Please see intake H&P.  Past Medical History:  Past Medical History:  Diagnosis Date  . Chest pain   . Gestational hypertension without significant proteinuria during pregnancy in third trimester, antepartum 07/01/2016  . Heart murmur   . Hypertension affecting pregnancy in third trimester    does not have issues with it anymore  . Pregnancy induced hypertension     Past Surgical History:  Procedure Laterality Date  . INDUCED ABORTION      Family Psychiatric History: None.  Family History:  Family History  Problem Relation Age of Onset  . Hypertension Mother   . Hyperlipidemia Mother   . Hypertension Father   . Heart attack Neg Hx     Social History:  Social History   Socioeconomic History  . Marital status: Single    Spouse name: Not on file  . Number of children: 3  .  Years of education: Not on file  . Highest education level: Not on file  Occupational History  . Occupation: Engineer, technical sales  Tobacco Use  . Smoking status: Former Smoker    Types: Cigarettes    Quit date: 06/03/2009    Years since quitting: 11.0  . Smokeless tobacco: Never Used  Vaping Use  . Vaping Use: Never used  Substance and Sexual Activity  . Alcohol use: No  . Drug use: No  .  Sexual activity: Not Currently    Partners: Male    Birth control/protection: None  Other Topics Concern  . Not on file  Social History Narrative  . Not on file   Social Determinants of Health   Financial Resource Strain:   . Difficulty of Paying Living Expenses:   Food Insecurity:   . Worried About Programme researcher, broadcasting/film/video in the Last Year:   . Barista in the Last Year:   Transportation Needs:   . Freight forwarder (Medical):   Marland Kitchen Lack of Transportation (Non-Medical):   Physical Activity:   . Days of Exercise per Week:   . Minutes of Exercise per Session:   Stress:   . Feeling of Stress :   Social Connections:   . Frequency of Communication with Friends and Family:   . Frequency of Social Gatherings with Friends and Family:   . Attends Religious Services:   . Active Member of Clubs or Organizations:   . Attends Banker Meetings:   Marland Kitchen Marital Status:     Allergies: No Known Allergies  Metabolic Disorder Labs: No results found for: HGBA1C, MPG No results found for: PROLACTIN No results found for: CHOL, TRIG, HDL, CHOLHDL, VLDL, LDLCALC Lab Results  Component Value Date   TSH 0.456 12/23/2015   TSH 2.073 06/03/2013    Therapeutic Level Labs: No results found for: LITHIUM No results found for: VALPROATE No components found for:  CBMZ  Current Medications: Current Outpatient Medications  Medication Sig Dispense Refill  . ALPRAZolam (XANAX) 0.25 MG tablet Take 1 tablet (0.25 mg total) by mouth 2 (two) times daily as needed for anxiety. 60 tablet 3  . enalapril (VASOTEC) 2.5 MG tablet Take 1 tablet (2.5 mg total) by mouth daily for 7 days. 7 tablet 0  . ferrous sulfate 325 (65 FE) MG tablet Take 1 tablet (325 mg total) by mouth 2 (two) times daily with a meal. 60 tablet 5  . ibuprofen (ADVIL,MOTRIN) 600 MG tablet Take 1 tablet (600 mg total) by mouth every 6 (six) hours. (Patient not taking: Reported on 02/06/2019) 60 tablet 0  . tinidazole  (TINDAMAX) 500 MG tablet Take 2 tablets (1,000 mg total) by mouth daily with breakfast. 10 tablet 2   No current facility-administered medications for this visit.   Facility-Administered Medications Ordered in Other Visits  Medication Dose Route Frequency Provider Last Rate Last Admin  . lidocaine (PF) (XYLOCAINE) 1 % injection    Anesthesia Intra-op Karie Schwalbe, MD   11 mL at 01/30/19 2218     Psychiatric Specialty Exam: Review of Systems  Psychiatric/Behavioral: The patient is nervous/anxious.   All other systems reviewed and are negative.   unknown if currently breastfeeding.There is no height or weight on file to calculate BMI.  General Appearance: NA  Eye Contact:  NA  Speech:  Clear and Coherent and Normal Rate  Volume:  Normal  Mood:  Episodic anxiety attecks  Affect:  NA  Thought Process:  Goal Directed  and Linear  Orientation:  Full (Time, Place, and Person)  Thought Content: Logical   Suicidal Thoughts:  No  Homicidal Thoughts:  No  Memory:  Immediate;   Good Recent;   Good Remote;   Good  Judgement:  Good  Insight:  Good  Psychomotor Activity:  NA  Concentration:  Concentration: Good  Recall:  Good  Fund of Knowledge: Good  Language: Good  Akathisia:  Negative  Handed:  Right  AIMS (if indicated): not done  Assets:  Communication Skills Desire for Improvement Housing Physical Health Talents/Skills  ADL's:  Intact  Cognition: WNL  Sleep:  Good   Screenings: GAD-7     Video Visit from 09/15/2019 in [facility] Office Visit from 06/09/2019 in [facility]  Total GAD-7 Score 16 15    PHQ2-9     Video Visit from 09/15/2019 in [facility]  PHQ-2 Total Score 1  PHQ-9 Total Score 9       Assessment and Plan: 29 yo SAAFwithno hx of panic anxiety, depression or any mental health issues until after she lost her first child in 2009-03-25. Her son died 11 days after delivery from  what she was told herpes infection. She became depressed, started to have panic attacks and was admitted to behavioral unit at [facility]. She was discharged on an antidepressant (which she took for about 6 months) and clonazepam. Once panic attacks and depression resolved she stopped taking medications and was stable until she delivered her next child (son) in 26-Mar-2011. Within two weeks after delivery she started to have panic attacks - now she thinks she was anxious if her newborn will survive or die just like her first child did. Once again she was prescribed alprazolam and again once panic attacks stopped she stopped taking it few months later. In 25-Mar-2016 she had her second son and the same scenario developed: started to have panic attacks, was prescribed alprazolam and after few months did not need it anymore. At that time she also went through a period of psychotherapy. In February 2019 she had her third child - a daughter. She developed eclampsia, anxiety increase and she was once more put on alprazolam 0.25 mg bid which she has stayed on since then. 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 Patient denies feeling depressed, hopeless, suicidal. She has no hx of mania, psychosis, alcohol or drug abuse. Despite taking alprazolam on a scheduled basis she still has episodic panic attacks and middle insomnia may reflect nighttime panic episodes. Panic attacks are not triggered by any clear circumstances/events rather appear when general level of stress is elevated (at work mostly). We have continued alprazolam 0.25 mg prn and started fluoxetine 20 mg daily then increased to 40 mg. She has run out of fluoxetine about a month ago and did not notice increase in anxiety. She now needs to take alprazolam 1-2 times a day to keep control of panic disorder.   Dx: Panic  disorder  Plan:Continue alprazolam to 0.25 mg bid prn anxiety. Next appointment in 3 months.The plan was discussed with patient who had an opportunity to ask questions and these were all answered. I spend84minutes inphonecontactwith the patient.    Magdalene Patricia, MD 07/05/2020, 8:39 AM

## 2020-07-25 ENCOUNTER — Other Ambulatory Visit: Payer: Self-pay

## 2020-07-25 ENCOUNTER — Other Ambulatory Visit (HOSPITAL_COMMUNITY)
Admission: RE | Admit: 2020-07-25 | Discharge: 2020-07-25 | Disposition: A | Discharge status: Home / Self Care | Payer: Medicaid Other | Source: Ambulatory Visit | Attending: Obstetrics | Admitting: Obstetrics

## 2020-07-25 ENCOUNTER — Encounter: Payer: Self-pay | Admitting: Obstetrics

## 2020-07-25 ENCOUNTER — Ambulatory Visit (INDEPENDENT_AMBULATORY_CARE_PROVIDER_SITE_OTHER): Payer: Medicaid Other | Admitting: Obstetrics

## 2020-07-25 VITALS — BP 134/90 | HR 86 | Ht 63.0 in | Wt 114.0 lb

## 2020-07-25 DIAGNOSIS — Z113 Encounter for screening for infections with a predominantly sexual mode of transmission: Secondary | ICD-10-CM

## 2020-07-25 DIAGNOSIS — N898 Other specified noninflammatory disorders of vagina: Secondary | ICD-10-CM | POA: Insufficient documentation

## 2020-07-25 DIAGNOSIS — Z3009 Encounter for other general counseling and advice on contraception: Secondary | ICD-10-CM | POA: Diagnosis not present

## 2020-07-25 DIAGNOSIS — Z01419 Encounter for gynecological examination (general) (routine) without abnormal findings: Secondary | ICD-10-CM | POA: Insufficient documentation

## 2020-07-25 NOTE — Progress Notes (Signed)
Subjective:        Debbie Wood is a 29 y.o. female here for a routine exam.  Current complaints: None.    Personal health questionnaire:  Is patient Ashkenazi Jewish, have a family history of breast and/or ovarian cancer: no Is there a family history of uterine cancer diagnosed at age < 60, gastrointestinal cancer, urinary tract cancer, family member who is a Personnel officer syndrome-associated carrier: no Is the patient overweight and hypertensive, family history of diabetes, personal history of gestational diabetes, preeclampsia or PCOS: no Is patient over 48, have PCOS,  family history of premature CHD under age 73, diabetes, smoke, have hypertension or peripheral artery disease:  no At any time, has a partner hit, kicked or otherwise hurt or frightened you?: no Over the past 2 weeks, have you felt down, depressed or hopeless?: no Over the past 2 weeks, have you felt little interest or pleasure in doing things?:no   Gynecologic History Patient's last menstrual period was 06/29/2020 (exact date). Contraception: none Last Pap: 06-09-2019. Results were: normal Last mammogram: n/a. Results were: n/a  Obstetric History OB History  Gravida Para Term Preterm AB Living  5 4 4  0 1 3  SAB TAB Ectopic Multiple Live Births  0 1 0 0 4    # Outcome Date GA Lbr Len/2nd Weight Sex Delivery Anes PTL Lv  5 Term 01/31/19 [redacted]w[redacted]d 12:25 / 00:11 6 lb 11.1 oz (3.036 kg) F Vag-Spont EPI  LIV  4 TAB 2018          3 Term 07/01/16 [redacted]w[redacted]d 01:58 / 00:05 6 lb 14.1 oz (3.12 kg) M Vag-Spont EPI  LIV     Birth Comments: birthmark on center of chest     Complications: High blood pressure  2 Term 04/08/11 [redacted]w[redacted]d  6 lb 7 oz (2.92 kg) M Vag-Spont EPI N LIV  1 Term 01/03/09 [redacted]w[redacted]d  6 lb 3 oz (2.807 kg) M Vag-Spont None  ND    Obstetric Comments  Pt induced     Past Medical History:  Diagnosis Date  . Chest pain   . Gestational hypertension without significant proteinuria during pregnancy in third trimester,  antepartum 07/01/2016  . Heart murmur   . Hypertension affecting pregnancy in third trimester    does not have issues with it anymore  . Pregnancy induced hypertension     Past Surgical History:  Procedure Laterality Date  . INDUCED ABORTION       Current Outpatient Medications:  .  ALPRAZolam (XANAX) 0.25 MG tablet, Take 1 tablet (0.25 mg total) by mouth 2 (two) times daily as needed for anxiety., Disp: 60 tablet, Rfl: 3 .  enalapril (VASOTEC) 2.5 MG tablet, Take 1 tablet (2.5 mg total) by mouth daily for 7 days., Disp: 7 tablet, Rfl: 0 .  ferrous sulfate 325 (65 FE) MG tablet, Take 1 tablet (325 mg total) by mouth 2 (two) times daily with a meal. (Patient not taking: Reported on 07/25/2020), Disp: 60 tablet, Rfl: 5 .  ibuprofen (ADVIL,MOTRIN) 600 MG tablet, Take 1 tablet (600 mg total) by mouth every 6 (six) hours. (Patient not taking: Reported on 02/06/2019), Disp: 60 tablet, Rfl: 0 .  tinidazole (TINDAMAX) 500 MG tablet, Take 2 tablets (1,000 mg total) by mouth daily with breakfast. (Patient not taking: Reported on 07/25/2020), Disp: 10 tablet, Rfl: 2 No current facility-administered medications for this visit.  Facility-Administered Medications Ordered in Other Visits:  .  lidocaine (PF) (XYLOCAINE) 1 % injection, , , Anesthesia  Riley Lam, MD, 11 mL at 01/30/19 2218 No Known Allergies  Social History   Tobacco Use  . Smoking status: Former Smoker    Types: Cigarettes    Quit date: 06/03/2009    Years since quitting: 11.1  . Smokeless tobacco: Never Used  Substance Use Topics  . Alcohol use: No    Family History  Problem Relation Age of Onset  . Hypertension Mother   . Hyperlipidemia Mother   . Hypertension Father   . Heart attack Neg Hx       Review of Systems  Constitutional: negative for fatigue and weight loss Respiratory: negative for cough and wheezing Cardiovascular: negative for chest pain, fatigue and palpitations Gastrointestinal: negative for  abdominal pain and change in bowel habits Musculoskeletal:negative for myalgias Neurological: negative for gait problems and tremors Behavioral/Psych: negative for abusive relationship, depression Endocrine: negative for temperature intolerance    Genitourinary:negative for abnormal menstrual periods, genital lesions, hot flashes, sexual problems and vaginal discharge Integument/breast: negative for breast lump, breast tenderness, nipple discharge and skin lesion(s)    Objective:       BP 134/90   Pulse 86   Ht 5\' 3"  (1.6 m)   Wt 114 lb (51.7 kg)   LMP 06/29/2020 (Exact Date)   BMI 20.19 kg/m  General:   alert and no distress  Skin:   no rash or abnormalities  Lungs:   clear to auscultation bilaterally  Heart:   regular rate and rhythm, S1, S2 normal, no murmur, click, rub or gallop  Breasts:   normal without suspicious masses, skin or nipple changes or axillary nodes  Abdomen:  normal findings: no organomegaly, soft, non-tender and no hernia  Pelvis:  External genitalia: normal general appearance Urinary system: urethral meatus normal and bladder without fullness, nontender Vaginal: normal without tenderness, induration or masses Cervix: normal appearance Adnexa: normal bimanual exam Uterus: anteverted and non-tender, normal size   Lab Review Urine pregnancy test Labs reviewed yes Radiologic studies reviewed no  50% of 20 min visit spent on counseling and coordination of care.   Assessment:     1. Encounter for routine gynecological examination with Papanicolaou smear of cervix Rx: - Cytology - PAP  2. Vaginal discharge Rx: - Cervicovaginal ancillary only  3. Screen for STD (sexually transmitted disease) Rx: - Hepatitis B surface antigen - Hepatitis C antibody - RPR - HIV Antibody (routine testing w rflx)  4. Encounter for other general counseling or advice on contraception - considering Depo     Plan:    Education reviewed: calcium supplements,  depression evaluation, low fat, low cholesterol diet, safe sex/STD prevention, self breast exams and weight bearing exercise. Contraception: considering Depo and other options. Follow up in: 1 year.   No orders of the defined types were placed in this encounter.  Orders Placed This Encounter  Procedures  . Hepatitis B surface antigen  . Hepatitis C antibody  . RPR  . HIV Antibody (routine testing w rflx)    07/01/2020, MD 07/25/2020 11:00 AM

## 2020-07-25 NOTE — Progress Notes (Signed)
Last pap: 06/09/2019 Pt. Request pap smear today at this visit Pt. Request STI screening Pt. Is not on b/c

## 2020-07-26 LAB — CERVICOVAGINAL ANCILLARY ONLY
Bacterial Vaginitis (gardnerella): POSITIVE — AB
Candida Glabrata: NEGATIVE
Candida Vaginitis: NEGATIVE
Chlamydia: NEGATIVE
Comment: NEGATIVE
Comment: NEGATIVE
Comment: NEGATIVE
Comment: NEGATIVE
Comment: NEGATIVE
Comment: NORMAL
Neisseria Gonorrhea: NEGATIVE
Trichomonas: NEGATIVE

## 2020-07-26 LAB — CYTOLOGY - PAP
Adequacy: ABSENT
Comment: NEGATIVE
Diagnosis: NEGATIVE
High risk HPV: NEGATIVE

## 2020-07-26 LAB — RPR: RPR Ser Ql: NONREACTIVE

## 2020-07-26 LAB — HIV ANTIBODY (ROUTINE TESTING W REFLEX): HIV Screen 4th Generation wRfx: NONREACTIVE

## 2020-07-26 LAB — HEPATITIS B SURFACE ANTIGEN: Hepatitis B Surface Ag: NEGATIVE

## 2020-07-26 LAB — HEPATITIS C ANTIBODY: Hep C Virus Ab: <0.1 s/co ratio (ref 0.0–0.9)

## 2020-08-15 ENCOUNTER — Telehealth: Payer: Self-pay

## 2020-08-15 MED ORDER — METRONIDAZOLE 500 MG PO TABS: 500.0000 mg | ORAL_TABLET | Freq: Two times a day (BID) | ORAL | 0 refills | Status: DC

## 2020-08-15 NOTE — Telephone Encounter (Signed)
err

## 2020-08-15 NOTE — Telephone Encounter (Signed)
See note

## 2020-08-15 NOTE — Telephone Encounter (Signed)
Patient was seen on 07/25/2020 for routine screening. She tested positive for BV. Per Ms.Ayler no one from the office called to inform her of test results. I have advised patient we will call her in Flagyl 500 mg to take bid for 14 days. I  apologize for not reaching out to her regarding test results.

## 2020-10-05 ENCOUNTER — Telehealth (INDEPENDENT_AMBULATORY_CARE_PROVIDER_SITE_OTHER): Payer: Medicaid Other | Admitting: Psychiatry

## 2020-10-05 ENCOUNTER — Other Ambulatory Visit: Payer: Self-pay

## 2020-10-05 DIAGNOSIS — F410 Panic disorder [episodic paroxysmal anxiety] without agoraphobia: Secondary | ICD-10-CM

## 2020-10-05 MED ORDER — ALPRAZOLAM 0.25 MG PO TABS: 0.2500 mg | ORAL_TABLET | Freq: Every day | ORAL | 3 refills | Status: DC | PRN

## 2020-10-05 NOTE — Progress Notes (Signed)
BH MD/PA/NP OP Progress Note  10/05/2020 8:42 AM Debbie Wood  MRN:  [id number] Interview was conducted by phone and I verified that I was speaking with the correct person using two identifiers. I discussed the limitations of evaluation and management by telemedicine and  the availability of in person appointments. Patient expressed understanding and agreed to proceed. Patient location - home; physician - home office.  Chief Complaint: Residual anxiety.  HPI: 29 yo SAAFwithno hx of panic anxiety, depression or any mental health issues until after she lost her first child in 03-25-2009. Her son died 11 days after delivery from what she was told herpes infection. She became depressed, started to have panic attacks and was admitted to behavioral unit at [facility]. She was discharged on an antidepressant (which she took for about 6 months) and clonazepam. Once panic attacks and depression resolved she stopped taking medications and was stable until she delivered her next child (son) in March 26, 2011. Within two weeks after delivery she started to have panic attacks - now she thinks she was anxious if her newborn will survive or die just like her first child did. Once again she was prescribed alprazolam and again once panic attacks stopped she stopped taking it few months later. In 03/25/16 she had her second son and the same scenario developed: started to have panic attacks, was prescribed alprazolam and after few months did not need it anymore. At that time she also went through a period of psychotherapy. In February 2019 she had her third child - a daughter. She developed eclampsia, anxiety increase and she was once more put on alprazolam 0.25 mg bid which she has stayed on since then. 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 Patient denies feeling depressed, hopeless, suicidal. She has  no hx of mania, psychosis, alcohol or drug abuse. Panic attacks are not triggered by any clear circumstances/events rather appear when general level of stress is elevated (at work mostly). We have continued alprazolam 0.25 mg prn and started fluoxetine 20 mg daily then increased to 40 mg. She has run out of fluoxetine about a month ago and did not notice increase in anxiety. She now needs to take alprazolam not more than once a day to keep control of panic disorder. She would like to start counseling in hope of being able to stop needing antianxiety medication.   Visit Diagnosis:    ICD-10-CM   1. Panic disorder  F41.0     Past Psychiatric History: Please see intake H&P.  Past Medical History:  Past Medical History:  Diagnosis Date  . Chest pain   . Gestational hypertension without significant proteinuria during pregnancy in third trimester, antepartum 07/01/2016  . Heart murmur   . Hypertension affecting pregnancy in third trimester    does not have issues with it anymore  . Pregnancy induced hypertension     Past Surgical History:  Procedure Laterality Date  . INDUCED ABORTION      Family Psychiatric History: None.  Family History:  Family History  Problem Relation Age of Onset  . Hypertension Mother   . Hyperlipidemia Mother   . Hypertension Father   . Heart attack Neg Hx     Social History:  Social History   Socioeconomic History  . Marital status: Single    Spouse name: Not on file  . Number of children: 3  . Years of education: Not on  file  . Highest education level: Not on file  Occupational History  . Occupation: Engineer, technical sales  Tobacco Use  . Smoking status: Former Smoker    Types: Cigarettes    Quit date: 06/03/2009    Years since quitting: 11.3  . Smokeless tobacco: Never Used  Vaping Use  . Vaping Use: Never used  Substance and Sexual Activity  . Alcohol use: No  . Drug use: No  . Sexual activity: Not Currently    Partners: Male     Birth control/protection: None  Other Topics Concern  . Not on file  Social History Narrative  . Not on file   Social Determinants of Health   Financial Resource Strain:   . Difficulty of Paying Living Expenses: Not on file  Food Insecurity:   . Worried About Programme researcher, broadcasting/film/video in the Last Year: Not on file  . Ran Out of Food in the Last Year: Not on file  Transportation Needs:   . Lack of Transportation (Medical): Not on file  . Lack of Transportation (Non-Medical): Not on file  Physical Activity:   . Days of Exercise per Week: Not on file  . Minutes of Exercise per Session: Not on file  Stress:   . Feeling of Stress : Not on file  Social Connections:   . Frequency of Communication with Friends and Family: Not on file  . Frequency of Social Gatherings with Friends and Family: Not on file  . Attends Religious Services: Not on file  . Active Member of Clubs or Organizations: Not on file  . Attends Banker Meetings: Not on file  . Marital Status: Not on file    Allergies: No Known Allergies  Metabolic Disorder Labs: No results found for: HGBA1C, MPG No results found for: PROLACTIN No results found for: CHOL, TRIG, HDL, CHOLHDL, VLDL, LDLCALC Lab Results  Component Value Date   TSH 0.456 12/23/2015   TSH 2.073 06/03/2013    Therapeutic Level Labs: No results found for: LITHIUM No results found for: VALPROATE No components found for:  CBMZ  Current Medications: Current Outpatient Medications  Medication Sig Dispense Refill  . ALPRAZolam (XANAX) 0.25 MG tablet Take 1 tablet (0.25 mg total) by mouth daily as needed for anxiety. 30 tablet 3  . enalapril (VASOTEC) 2.5 MG tablet Take 1 tablet (2.5 mg total) by mouth daily for 7 days. 7 tablet 0  . ferrous sulfate 325 (65 FE) MG tablet Take 1 tablet (325 mg total) by mouth 2 (two) times daily with a meal. (Patient not taking: Reported on 07/25/2020) 60 tablet 5  . ibuprofen (ADVIL,MOTRIN) 600 MG tablet Take 1  tablet (600 mg total) by mouth every 6 (six) hours. (Patient not taking: Reported on 02/06/2019) 60 tablet 0  . metroNIDAZOLE (FLAGYL) 500 MG tablet Take 1 tablet (500 mg total) by mouth 2 (two) times daily. 14 tablet 0  . tinidazole (TINDAMAX) 500 MG tablet Take 2 tablets (1,000 mg total) by mouth daily with breakfast. (Patient not taking: Reported on 07/25/2020) 10 tablet 2   No current facility-administered medications for this visit.   Facility-Administered Medications Ordered in Other Visits  Medication Dose Route Frequency Provider Last Rate Last Admin  . lidocaine (PF) (XYLOCAINE) 1 % injection    Anesthesia Intra-op Karie Schwalbe, MD   11 mL at 01/30/19 2218     Psychiatric Specialty Exam: Review of Systems  Psychiatric/Behavioral: The patient is nervous/anxious.   All other systems reviewed and are negative.  unknown if currently breastfeeding.There is no height or weight on file to calculate BMI.  General Appearance: NA  Eye Contact:  NA  Speech:  Clear and Coherent and Normal Rate  Volume:  Normal  Mood:  Episodic anxiety.  Affect:  NA  Thought Process:  Goal Directed and Linear  Orientation:  Full (Time, Place, and Person)  Thought Content: Logical   Suicidal Thoughts:  No  Homicidal Thoughts:  No  Memory:  Immediate;   Good Recent;   Good Remote;   Good  Judgement:  Good  Insight:  Fair  Psychomotor Activity:  NA  Concentration:  Concentration: Good  Recall:  Good  Fund of Knowledge: Good  Language: Good  Akathisia:  Negative  Handed:  Right  AIMS (if indicated): not done  Assets:  Communication Skills Desire for Improvement Housing Resilience Talents/Skills  ADL's:  Intact  Cognition: WNL  Sleep:  Good   Screenings: GAD-7     [facility]  Total GAD-7 Score 16 15    PHQ2-9     [facility]  PHQ-2 Total Score 1  PHQ-9 Total Score 9       Assessment and Plan: 29 yo SAAFwithno hx of panic anxiety, depression or any mental health issues until after she lost her first child in 2010. Her son died 11 days after delivery from what she was told herpes infection. She became depressed, started to have panic attacks and was [facility]. She was discharged on an antidepressant (which she took for about 6 months) and clonazepam. Once panic attacks and depression resolved she stopped taking medications and was stable until she delivered her next child (son) in 2012. Within two weeks after delivery she started to have panic attacks - now she thinks she was anxious if her newborn will survive or die just like her first child did. Once again she was prescribed alprazolam and again once panic attacks stopped she stopped taking it few months later. In 2017 she had her second son and the same scenario developed: started to have panic attacks, was prescribed alprazolam and after few months did not need it anymore. At that time she also went through a period of psychotherapy. In February 2019 she had her third child - a daughter. She developed eclampsia, anxiety increase and she was once more put on alprazolam 0.25 mg bid which she has stayed on since then. 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 Patient denies feeling depressed, hopeless, suicidal. She has no hx of mania, psychosis, alcohol or drug abuse. Panic attacks are not triggered by any clear circumstances/events rather appear when general level of stress is elevated (at work mostly). We have continued alprazolam 0.25 mg prn and started fluoxetine 20 mg daily then increased to 40 mg. She has run out of fluoxetine about a month ago and did not notice increase in anxiety.  She now needs to take alprazolam not more than once a day to keep control of panic disorder. She would like to start counseling in hope of being able to stop needing antianxiety medication.  Dx: Panic disorder  Plan:Continuealprazolam to 0.25 mgdaily prnanxiety. Next appointment in 3 months.I will ask for a therapy appointment/referral for her. The plan  was discussed with patient who had an opportunity to ask questions and these were all answered. I spend61minutes inphonecontactwith the patient.    Magdalene Patricia, MD 10/05/2020, 8:42 AM

## 2021-01-03 ENCOUNTER — Other Ambulatory Visit: Payer: Self-pay

## 2021-01-03 ENCOUNTER — Telehealth (INDEPENDENT_AMBULATORY_CARE_PROVIDER_SITE_OTHER): Payer: Medicaid Other | Admitting: Psychiatry

## 2021-01-03 DIAGNOSIS — F410 Panic disorder [episodic paroxysmal anxiety] without agoraphobia: Secondary | ICD-10-CM | POA: Diagnosis not present

## 2021-01-03 MED ORDER — ALPRAZOLAM 0.25 MG PO TABS: 0.2500 mg | ORAL_TABLET | Freq: Every day | ORAL | 4 refills | Status: AC | PRN

## 2021-01-03 NOTE — Progress Notes (Signed)
BH MD/PA/NP OP Progress Note  01/03/2021 9:40 AM Debbie KAFFENBERGER  MRN:  [id number] Interview was conducted using videoconferencing application and I verified that I was speaking with the correct person using two identifiers. I discussed the limitations of evaluation and management by telemedicine and  the availability of in person appointments. Patient expressed understanding and agreed to proceed. Participants in the visit: patient (location - home); physician (location - home office).  Chief Complaint: Episodic anxiety attacks.  HPI: 30 yo SAAFwithno hx of panic anxiety, depression or any mental health issues until after she lost her first child in 2009-03-22. Her son died 11 days after delivery from what she was told herpes infection. She became depressed, started to have panic attacks and was admitted to behavioral unit at [facility]. She was discharged on an antidepressant (which she took for about 6 months) and clonazepam. Once panic attacks and depression resolved she stopped taking medications and was stable until she delivered her next child (son) in 03-23-2011. Within two weeks after delivery she started to have panic attacks - now she thinks she was anxious if her newborn will survive or die just like her first child did. Once again she was prescribed alprazolam and again once panic attacks stopped she stopped taking it few months later. In 03/22/2016 she had her second son and the same scenario developed: started to have panic attacks, was prescribed alprazolam and after few months did not need it anymore. At that time she also went through a period of psychotherapy. In February 2019 she had her third child - a daughter. She developed eclampsia, anxiety increase and she was once more put on alprazolam 0.25 mg bid which she has stayed on since then. 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 Patient denies feeling depressed, hopeless, suicidal. She has no hx of mania, psychosis, alcohol or drug abuse. Panic attacks are not triggered by any clear circumstances/events rather appear when general level of stress is elevated (at work mostly). We have continued alprazolam 0.25 mg prn and started fluoxetine 20 mg daily then increased to 40 mg. She has run out of fluoxetine about a month ago and did not notice increase in anxiety. She now needs to take alprazolam not more than once a day to keep control of panic disorder.She would like to start counseling in hope of being able to stop needing antianxiety medication. She has been in contact with our office but is still waiting for a call back.    Visit Diagnosis:    ICD-10-CM   1. Panic disorder  F41.0     Past Psychiatric History: Please see intake H&P.  Past Medical History:  Past Medical History:  Diagnosis Date  . Chest pain   . Gestational hypertension without significant proteinuria during pregnancy in third trimester, antepartum 07/01/2016  . Heart murmur   . Hypertension affecting pregnancy in third trimester    does not have issues with it anymore  . Pregnancy induced hypertension     Past Surgical History:  Procedure Laterality Date  . INDUCED ABORTION      Family Psychiatric History: None  Family History:  Family History  Problem Relation Age of Onset  . Hypertension Mother   . Hyperlipidemia Mother   . Hypertension Father   . Heart attack Neg Hx     Social History:  Social History   Socioeconomic History  . Marital  status: Single    Spouse name: Not on file  . Number of children: 3  . Years of education: Not on file  . Highest education level: Not on file  Occupational History  . Occupation: Engineer, technical sales  Tobacco Use  . Smoking status: Former Smoker    Types: Cigarettes    Quit date: 06/03/2009    Years since quitting: 11.5  . Smokeless tobacco: Never Used  Vaping Use   . Vaping Use: Never used  Substance and Sexual Activity  . Alcohol use: No  . Drug use: No  . Sexual activity: Not Currently    Partners: Male    Birth control/protection: None  Other Topics Concern  . Not on file  Social History Narrative  . Not on file   Social Determinants of Health   Financial Resource Strain: Not on file  Food Insecurity: Not on file  Transportation Needs: Not on file  Physical Activity: Not on file  Stress: Not on file  Social Connections: Not on file    Allergies: No Known Allergies  Metabolic Disorder Labs: No results found for: HGBA1C, MPG No results found for: PROLACTIN No results found for: CHOL, TRIG, HDL, CHOLHDL, VLDL, LDLCALC Lab Results  Component Value Date   TSH 0.456 12/23/2015   TSH 2.073 06/03/2013    Therapeutic Level Labs: No results found for: LITHIUM No results found for: VALPROATE No components found for:  CBMZ  Current Medications: Current Outpatient Medications  Medication Sig Dispense Refill  . ALPRAZolam (XANAX) 0.25 MG tablet Take 1 tablet (0.25 mg total) by mouth daily as needed for anxiety. 30 tablet 4  . enalapril (VASOTEC) 2.5 MG tablet Take 1 tablet (2.5 mg total) by mouth daily for 7 days. 7 tablet 0  . ferrous sulfate 325 (65 FE) MG tablet Take 1 tablet (325 mg total) by mouth 2 (two) times daily with a meal. (Patient not taking: Reported on 07/25/2020) 60 tablet 5  . ibuprofen (ADVIL,MOTRIN) 600 MG tablet Take 1 tablet (600 mg total) by mouth every 6 (six) hours. (Patient not taking: Reported on 02/06/2019) 60 tablet 0  . metroNIDAZOLE (FLAGYL) 500 MG tablet Take 1 tablet (500 mg total) by mouth 2 (two) times daily. 14 tablet 0  . tinidazole (TINDAMAX) 500 MG tablet Take 2 tablets (1,000 mg total) by mouth daily with breakfast. (Patient not taking: Reported on 07/25/2020) 10 tablet 2   No current facility-administered medications for this visit.   Facility-Administered Medications Ordered in Other Visits   Medication Dose Route Frequency Provider Last Rate Last Admin  . lidocaine (PF) (XYLOCAINE) 1 % injection    Anesthesia Intra-op Karie Schwalbe, MD   11 mL at 01/30/19 2218      Psychiatric Specialty Exam: Review of Systems  Psychiatric/Behavioral: The patient is nervous/anxious.   All other systems reviewed and are negative.   unknown if currently breastfeeding.There is no height or weight on file to calculate BMI.  General Appearance: Casual  Eye Contact:  Good  Speech:  Clear and Coherent and Normal Rate  Volume:  Normal  Mood:  Episodic anxiety  Affect:  Full Range  Thought Process:  Goal Directed  Orientation:  Full (Time, Place, and Person)  Thought Content: Logical   Suicidal Thoughts:  No  Homicidal Thoughts:  No  Memory:  Immediate;   Good Recent;   Good Remote;   Good  Judgement:  Good  Insight:  Good  Psychomotor Activity:  Normal  Concentration:  Concentration: Good  Recall:  Good  Fund of Knowledge: Good  Language: Good  Akathisia:  Negative  Handed:  Right  AIMS (if indicated): not done  Assets:  Communication Skills Desire for Improvement Financial Resources/Insurance Housing Physical Health Talents/Skills  ADL's:  Intact  Cognition: WNL  Sleep:  Good   Screenings: GAD-7   Flowsheet Row Video Visit from 09/15/2019 in Center for [facility] Office Visit from [facility]/09/2019 [facility]  Total GAD-7 Score 16 15    PHQ2-9   Flowsheet Row Video Visit from 09/15/2019 in Center for [facility]  PHQ-2 Total Score 1  PHQ-9 Total Score 9       Assessment and Plan: 30 yo SAAFwithno hx of panic anxiety, depression or any mental health issues until after she lost her first child in March 16, 2009. Her son died 11 days after delivery from what she was told herpes infection. She became depressed, started to have panic attacks and was admitted to behavioral unit at [facility]. She was  discharged on an antidepressant (which she took for about 6 months) and clonazepam. Once panic attacks and depression resolved she stopped taking medications and was stable until she delivered her next child (son) in 2011-03-17. Within two weeks after delivery she started to have panic attacks - now she thinks she was anxious if her newborn will survive or die just like her first child did. Once again she was prescribed alprazolam and again once panic attacks stopped she stopped taking it few months later. In Mar 16, 2016 she had her second son and the same scenario developed: started to have panic attacks, was prescribed alprazolam and after few months did not need it anymore. At that time she also went through a period of psychotherapy. In February 2019 she had her third child - a daughter. She developed eclampsia, anxiety increase and she was once more put on alprazolam 0.25 mg bid which she has stayed on since then. 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 Patient denies feeling depressed, hopeless, suicidal. She has no hx of mania, psychosis, alcohol or drug abuse. Panic attacks are not triggered by any clear circumstances/events rather appear when general level of stress is elevated (at work mostly). We have continued alprazolam 0.25 mg prn and started fluoxetine 20 mg daily then increased to 40 mg. She has run out of fluoxetine about a month ago and did not notice increase in anxiety. She now needs to take alprazolam not more than once a day to keep control of panic disorder.She would like to start counseling in hope of being able to stop needing antianxiety medication. She has been in contact with our office but is still waiting for a call back.  Dx: Panic disorder  Plan:Continuealprazolam to 0.25 mgdaily prnanxiety. Next appointment in48months with a new provider.I encouraged her to call our  office to inquire about her counseling options. The plan was discussed with patient who had an opportunity to ask questions and these were all answered. I spend25minutes invideocontactwith the patient.   Magdalene Patricia, MD 01/03/2021, 9:40 AM

## 2021-01-06 ENCOUNTER — Telehealth (HOSPITAL_COMMUNITY): Payer: Medicaid Other | Admitting: Psychiatry

## 2021-02-14 ENCOUNTER — Other Ambulatory Visit: Payer: Self-pay

## 2021-02-14 ENCOUNTER — Ambulatory Visit (HOSPITAL_COMMUNITY)
Admission: EM | Admit: 2021-02-14 | Discharge: 2021-02-14 | Disposition: A | Discharge status: Home / Self Care | Payer: Medicaid Other | Attending: Urgent Care | Admitting: Urgent Care

## 2021-02-14 ENCOUNTER — Encounter (HOSPITAL_COMMUNITY): Payer: Self-pay | Admitting: *Deleted

## 2021-02-14 DIAGNOSIS — R519 Headache, unspecified: Secondary | ICD-10-CM

## 2021-02-14 DIAGNOSIS — K047 Periapical abscess without sinus: Secondary | ICD-10-CM | POA: Diagnosis not present

## 2021-02-14 DIAGNOSIS — I10 Essential (primary) hypertension: Secondary | ICD-10-CM

## 2021-02-14 DIAGNOSIS — R030 Elevated blood-pressure reading, without diagnosis of hypertension: Secondary | ICD-10-CM

## 2021-02-14 MED ORDER — CHLORHEXIDINE GLUCONATE 0.12 % MT SOLN: OROMUCOSAL | 0 refills | Status: DC

## 2021-02-14 MED ORDER — NAPROXEN 500 MG PO TABS: 500.0000 mg | ORAL_TABLET | Freq: Two times a day (BID) | ORAL | 0 refills | Status: DC

## 2021-02-14 MED ORDER — AMOXICILLIN-POT CLAVULANATE 875-125 MG PO TABS: 1.0000 | ORAL_TABLET | Freq: Two times a day (BID) | ORAL | 0 refills | Status: DC

## 2021-02-14 MED ORDER — LOSARTAN POTASSIUM 50 MG PO TABS: 50.0000 mg | ORAL_TABLET | Freq: Every day | ORAL | 0 refills | Status: DC

## 2021-02-14 NOTE — ED Triage Notes (Signed)
Pt reports swelling to Rt lower gum started yesterday and to day she has pressure to Rt side on neck.

## 2021-02-14 NOTE — Discharge Instructions (Addendum)
Start losartan today, take 1 pill daily for blood pressure control.    For diabetes or elevated blood sugar, please make sure you are limiting and avoiding starchy, carbohydrate foods like pasta, breads, sweet breads, pastry, rice, potatoes, desserts. These foods can elevate your blood sugar. Also, limit and avoid drinks that contain a lot of sugar such as sodas, sweet teas, fruit juices.  Drinking plain water will be much more helpful, try 64 ounces of water daily.  It is okay to flavor your water naturally by cutting cucumber, lemon, mint or lime, placing it in a picture with water and drinking it over a period of 24-48 hours as long as it remains refrigerated.  For elevated blood pressure, make sure you are monitoring salt in your diet.  Do not eat restaurant foods and limit processed foods at home. I highly recommend you prepare and cook your own foods at home.  Processed foods include things like frozen meals, pre-seasoned meats and dinners, deli meats, canned foods as these foods contain a high amount of sodium/salt.  Make sure you are paying attention to sodium labels on foods you buy at the grocery store. Buy your spices separately such as garlic powder, onion powder, cumin, cayenne, parsley flakes so that you can avoid seasonings that contain salt. However, salt-free seasonings are available and can be used, an example is Mrs. Dash and includes a lot of different mixtures that do not contain salt.  Lastly, when cooking using oils that are healthier for you is important. This includes olive oil, avocado oil, canola oil. We have discussed a lot of foods to avoid but below is a list of foods that can be very healthy to use in your diet whether it is for diabetes, cholesterol, high blood pressure, or in general healthy eating.  Salads - kale, spinach, cabbage, spring mix, arugula Fruits - avocadoes, berries (blueberries, raspberries, blackberries), apples, oranges, pomegranate, grapefruit,  kiwi Vegetables - asparagus, cauliflower, broccoli, green beans, brussel sprouts, bell peppers, beets; stay away from or limit starchy vegetables like potatoes, carrots, peas Other general foods - kidney beans, egg whites, almonds, walnuts, sunflower seeds, pumpkin seeds, fat free yogurt, almond milk, flax seeds, quinoa, oats  Meat - It is better to eat lean meats and limit your red meat including pork to once a week.  Wild caught fish, chicken breast are good options as they tend to be leaner sources of good protein. Still be mindful of the sodium labels for the meats you buy.  DO NOT EAT ANY FOODS ON THIS LIST THAT YOU ARE ALLERGIC TO. For more specific needs, I highly recommend consulting a dietician or nutritionist but this can definitely be a good starting point.   Please schedule naproxen twice daily with food for your severe pain.  If you still have pain despite taking naproxen regularly, this is breakthrough pain.  You can use hydrocodone, a narcotic pain medicine, once every 4-6 hours for this.  Once your pain is better controlled, switch back to just naproxen. You can also use Tylenol at a dose of 500mg -650mg  once every 6 hours as needed for your aches, pains, fevers.   Make sure you schedule an appointment with a dentist/dental surgeon as soon as possible.  You may try some of the resources below.    [facility] [phone] extension [phone]  Dr. [phone] [address]  [place] [phone] [address] [facility].  [facility] [phone] extension [phone]  [address] [place], [place], [address] First come first serve for the first 10 clients.  May do simple extractions only, no wisdom teeth or surgery.  You may try the second for Thursday of the month starting at 6:30 AM.  [facility] You may call the school to see if they are still helping to provide dental care for emergent cases.

## 2021-02-14 NOTE — ED Provider Notes (Signed)
[facility]   MRN: [id number] DOB: 06-10-1991  Subjective:   Debbie Wood is a 30 y.o. female presenting for 1 day history of acute onset right-sided facial pain, swelling, gum pain.  Patient has been using peroxide, salt water rinses.  Has been using ibuprofen.  Pain has worsened to her neck and the ear.  Regarding her blood pressure, states that she has not taken her blood pressure medication anymore.  Had this during pregnancy and after she gave birth did not follow-up with anybody regarding her blood pressure.  Denies confusion, chest pain, shortness of breath, heart racing, belly pain, hematuria.  No current facility-administered medications for this encounter.  Current Outpatient Medications:  .  ALPRAZolam (XANAX) 0.25 MG tablet, Take 1 tablet (0.25 mg total) by mouth daily as needed for anxiety., Disp: 30 tablet, Rfl: 4 .  enalapril (VASOTEC) 2.5 MG tablet, Take 1 tablet (2.5 mg total) by mouth daily for 7 days., Disp: 7 tablet, Rfl: 0 .  ferrous sulfate 325 (65 FE) MG tablet, Take 1 tablet (325 mg total) by mouth 2 (two) times daily with a meal. (Patient not taking: Reported on 07/25/2020), Disp: 60 tablet, Rfl: 5 .  ibuprofen (ADVIL,MOTRIN) 600 MG tablet, Take 1 tablet (600 mg total) by mouth every 6 (six) hours. (Patient not taking: Reported on 02/06/2019), Disp: 60 tablet, Rfl: 0 .  metroNIDAZOLE (FLAGYL) 500 MG tablet, Take 1 tablet (500 mg total) by mouth 2 (two) times daily., Disp: 14 tablet, Rfl: 0 .  tinidazole (TINDAMAX) 500 MG tablet, Take 2 tablets (1,000 mg total) by mouth daily with breakfast. (Patient not taking: Reported on 07/25/2020), Disp: 10 tablet, Rfl: 2  Facility-Administered Medications Ordered in Other Encounters:  .  lidocaine (PF) (XYLOCAINE) 1 % injection, , , Anesthesia Intra-op, Karie Schwalbe, MD, 11 mL at 01/30/19 2218   No Known Allergies  Past Medical History:  Diagnosis Date  . Chest pain   . Gestational hypertension without  significant proteinuria during pregnancy in third trimester, antepartum 07/01/2016  . Heart murmur   . Hypertension affecting pregnancy in third trimester    does not have issues with it anymore  . Pregnancy induced hypertension      Past Surgical History:  Procedure Laterality Date  . INDUCED ABORTION      Family History  Problem Relation Age of Onset  . Hypertension Mother   . Hyperlipidemia Mother   . Hypertension Father   . Heart attack Neg Hx     Social History   Tobacco Use  . Smoking status: Former Smoker    Types: Cigarettes    Quit date: 06/03/2009    Years since quitting: 11.7  . Smokeless tobacco: Never Used  Vaping Use  . Vaping Use: Never used  Substance Use Topics  . Alcohol use: No  . Drug use: No    ROS   Objective:   Vitals: BP (!) 159/110 (BP Location: Left Arm)   Pulse 95   Temp 98.8 F (37.1 C)   Resp 18   LMP 02/13/2021   BP Readings from Last 3 Encounters:  02/14/21 (!) 159/110  07/25/20 134/90  06/04/20 (!) 156/108   Physical Exam Constitutional:      General: She is not in acute distress.    Appearance: Normal appearance. She is well-developed. She is not ill-appearing, toxic-appearing or diaphoretic.  HENT:     Head: Normocephalic and atraumatic.     Nose: Nose normal.  Mouth/Throat:     Mouth: Mucous membranes are moist.   Eyes:     Extraocular Movements: Extraocular movements intact.     Pupils: Pupils are equal, round, and reactive to light.  Cardiovascular:     Rate and Rhythm: Normal rate and regular rhythm.     Pulses: Normal pulses.     Heart sounds: Normal heart sounds. No murmur heard. No friction rub. No gallop.   Pulmonary:     Effort: Pulmonary effort is normal. No respiratory distress.     Breath sounds: Normal breath sounds. No stridor. No wheezing, rhonchi or rales.  Chest:     Chest wall: No tenderness.  Skin:    General: Skin is warm and dry.     Findings: No rash.  Neurological:     Mental  Status: She is alert and oriented to person, place, and time.     Cranial Nerves: No cranial nerve deficit.     Motor: No weakness.     Coordination: Coordination normal.     Gait: Gait normal.     Deep Tendon Reflexes: Reflexes normal.  Psychiatric:        Mood and Affect: Mood normal.        Behavior: Behavior normal.        Thought Content: Thought content normal.        Judgment: Judgment normal.     Assessment and Plan :   PDMP not reviewed this encounter.  1. Dental infection   2. Facial pain   3. Essential hypertension   4. Elevated blood pressure reading     Start Augmentin for dental infection/abscess, use chlorhexidine rinse, naproxen for pain and inflammation.  Stop ibuprofen, use hydrocodone for breakthrough pain.  Okay to use Tylenol together with this as well.  Emphasized need for dental surgeon consult.  Regarding her blood pressure, will start her on losartan, reviewed dietary modifications and hypertensive friendly diet.  Establish care through PCP assistance program.  Counseled patient on potential for adverse effects with medications prescribed/recommended today, strict ER and return-to-clinic precautions discussed, patient verbalized understanding.    Wallis Bamberg, PA-C 02/14/21 1159

## 2021-04-24 ENCOUNTER — Telehealth (HOSPITAL_COMMUNITY): Payer: Self-pay

## 2021-04-24 NOTE — Telephone Encounter (Signed)
Medication management - Telephone call with pt after speaking with [facility], pharmacy tech at patient's [facility] to verify patient has 1 additional 30 day supply of Alprazolam still remaining, last filled 04/04/21.  Informed patient that the provider that was going to take Dr. Stanton Kidney caseload after he retired would now not be coming to [employer], that our other providers already have full caseloads, so we would be referring her out for medication management services outside our [facility] practices.  Patient stated understanding and that she would be receiving a letter with some referral options in the mail or she could locate any other provider of her choosing.  Informed patient her appointment for 04/27/21 would be cancelled and patient agreed to call back if any issues or problems during transition period.

## 2021-04-27 ENCOUNTER — Telehealth (HOSPITAL_COMMUNITY): Payer: Medicaid Other | Admitting: Psychiatry

## 2021-06-01 ENCOUNTER — Ambulatory Visit: Payer: Medicaid Other | Admitting: Internal Medicine

## 2021-08-10 IMAGING — CR DG SHOULDER 2+V*R*
2 series · 2 of 2 positions shown · non-contrast
Comparison: No priors.

CLINICAL DATA: 28-year-old female with history of pain in the right
shoulder after falling yesterday evening.

EXAM:
RIGHT SHOULDER - 2+ VIEW

[shoulder grashey]
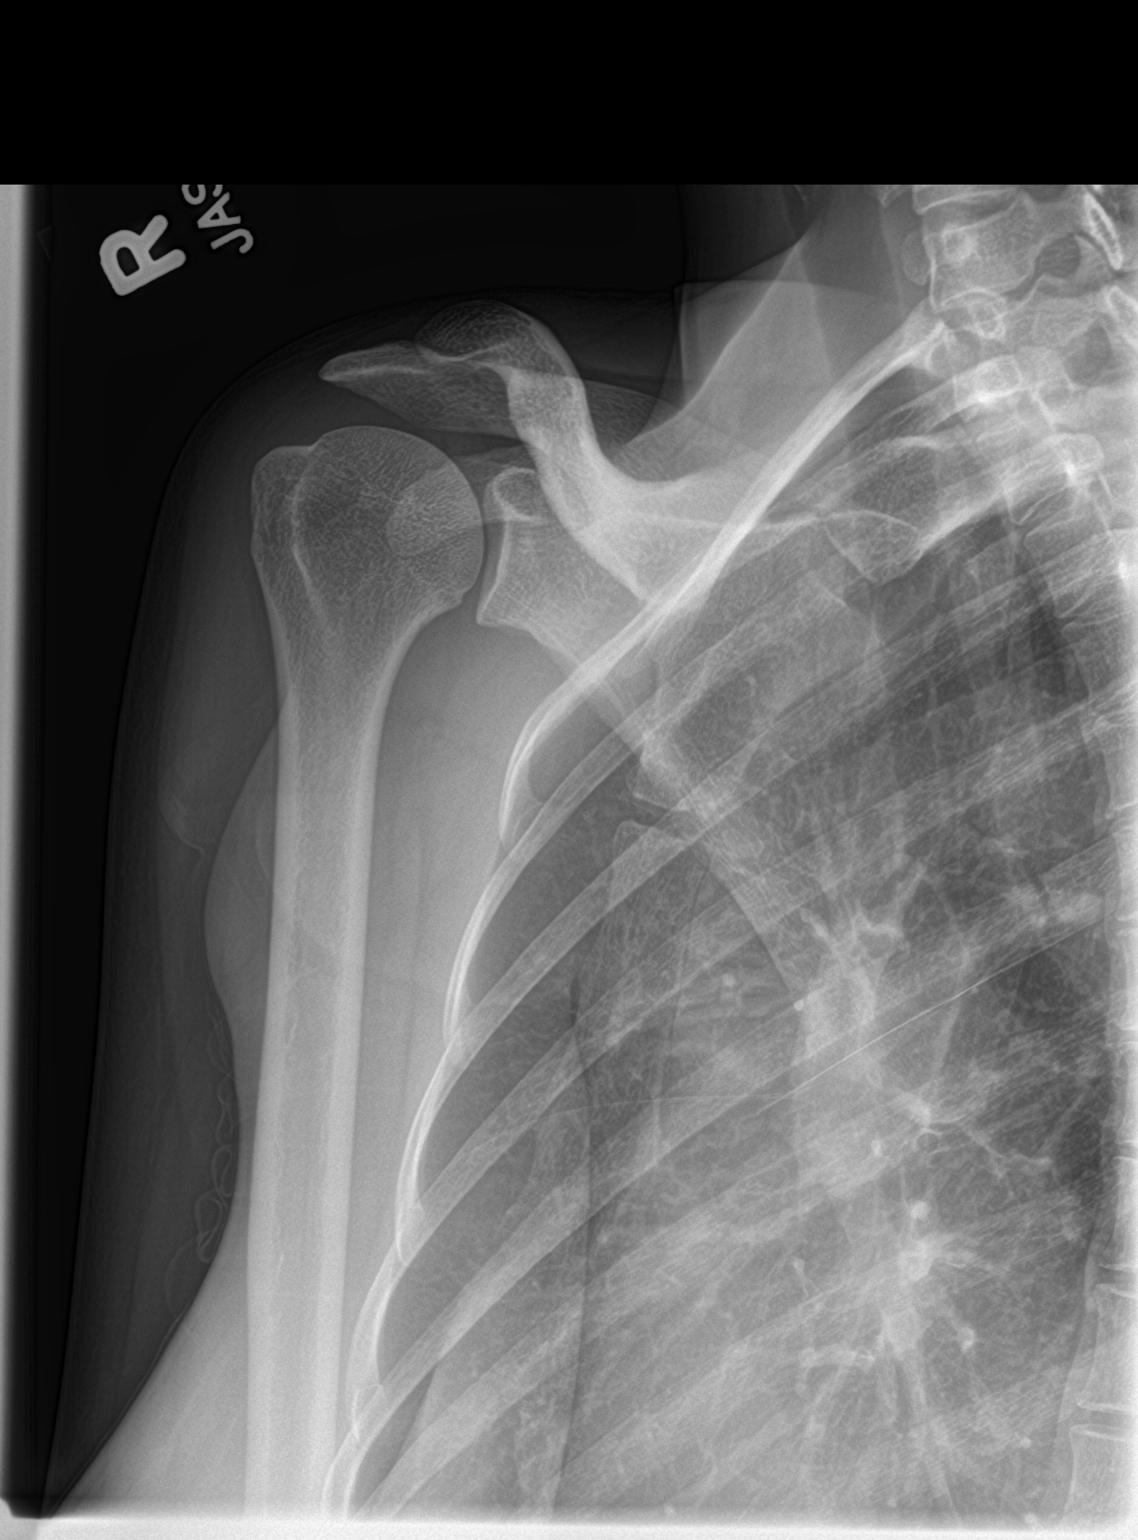

[shoulder y view]
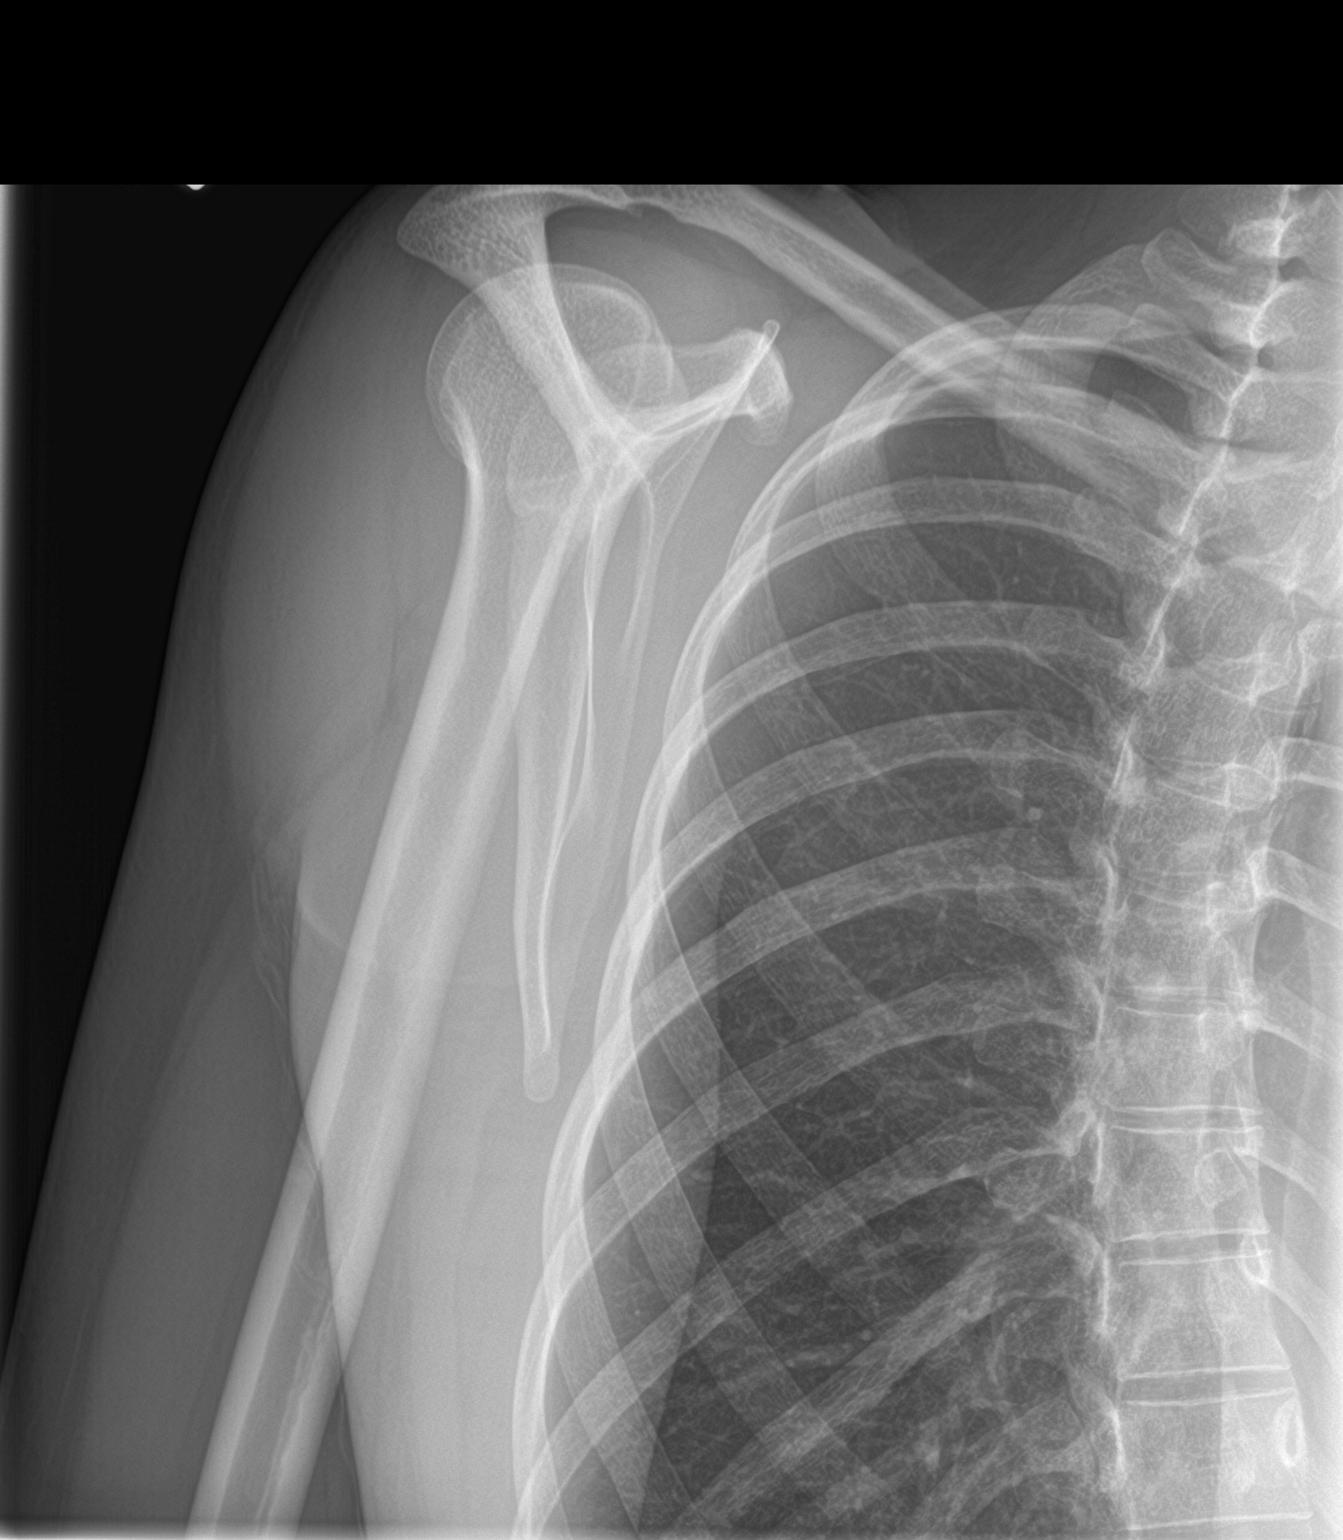

[2 of 2 positions shown; findings below may reference images not displayed]

FINDINGS: There is no evidence of fracture or dislocation. There is no
evidence of arthropathy or other focal bone abnormality. Soft
tissues are unremarkable.
IMPRESSION: Negative.

## 2023-06-19 ENCOUNTER — Ambulatory Visit: Payer: Medicaid Other | Admitting: Family Medicine

## 2024-01-26 ENCOUNTER — Telehealth: Payer: Medicaid Other | Admitting: Family

## 2024-01-26 DIAGNOSIS — K047 Periapical abscess without sinus: Secondary | ICD-10-CM

## 2024-01-26 MED ORDER — AMOXICILLIN-POT CLAVULANATE 875-125 MG PO TABS: 1.0000 | ORAL_TABLET | Freq: Two times a day (BID) | ORAL | 0 refills | Status: DC

## 2024-01-26 NOTE — Progress Notes (Signed)
 Virtual Visit Consent   Debbie Wood, you are scheduled for a virtual visit with a [facility] provider today. Just as with appointments in the office, your consent must be obtained to participate. Your consent will be active for this visit and any virtual visit you may have with one of our providers in the next 365 days. If you have a MyChart account, a copy of this consent can be sent to you electronically.  As this is a virtual visit, video technology does not allow for your provider to perform a traditional examination. This may limit your provider's ability to fully assess your condition. If your provider identifies any concerns that need to be evaluated in person or the need to arrange testing (such as labs, EKG, etc.), we will make arrangements to do so. Although advances in technology are sophisticated, we cannot ensure that it will always work on either your end or our end. If the connection with a video visit is poor, the visit may have to be switched to a telephone visit. With either a video or telephone visit, we are not always able to ensure that we have a secure connection.  By engaging in this virtual visit, you consent to the provision of healthcare and authorize for your insurance to be billed (if applicable) for the services provided during this visit. Depending on your insurance coverage, you may receive a charge related to this service.  I need to obtain your verbal consent now. Are you willing to proceed with your visit today? Debbie Wood has provided verbal consent on 01/26/2024 for a virtual visit (video or telephone). Jannifer Rodney, FNP  Date: 01/26/2024 12:27 PM   Virtual Visit via Video Note   I, Jannifer Rodney, connected with  Debbie Wood  ([id number], 02/21/1991) on 01/26/24 at 12:00 PM EST by a video-enabled telemedicine application and verified that I am speaking with the correct person using two identifiers.  Location: Patient: Virtual Visit  Location Patient: Home Provider: Virtual Visit Location Provider: Home Office   I discussed the limitations of evaluation and management by telemedicine and the availability of in person appointments. The patient expressed understanding and agreed to proceed.    History of Present Illness: Debbie Wood is a 33 y.o. who identifies as a female who was assigned female at birth, and is being seen today for abscess tooth of left lower gum that started a few days ago and has worsen. Reports constant aching pain of 7 out 10. Taking motrin as needed and cool compresses. Reports low grade fever yesterday, but not today. She is able to eat and drink.   HPI: HPI  Problems:  Patient Active Problem List   Diagnosis Date Noted   Panic disorder 12/08/2019   Preeclampsia 01/30/2019   Supervision of other normal pregnancy, antepartum 07-Oct-2018   History of neonatal death 09-12-2018   Encounter for supervision of other normal pregnancy in first trimester 12/23/2015   Heart murmur     Allergies: No Known Allergies Medications:  Current Outpatient Medications:    amoxicillin-clavulanate (AUGMENTIN) 875-125 MG tablet, Take 1 tablet by mouth 2 (two) times daily., Disp: 14 tablet, Rfl: 0   chlorhexidine (PERIDEX) 0.12 % solution, Rinse mouth with 15mL for 30 seconds twice daily., Disp: 473 mL, Rfl: 0   ferrous sulfate 325 (65 FE) MG tablet, Take 1 tablet (325 mg total) by mouth 2 (two) times daily with a meal. (Patient not taking: Reported on 07/25/2020), Disp: 60 tablet, Rfl: 5  ibuprofen (ADVIL,MOTRIN) 600 MG tablet, Take 1 tablet (600 mg total) by mouth every 6 (six) hours. (Patient not taking: Reported on 02/06/2019), Disp: 60 tablet, Rfl: 0   losartan (COZAAR) 50 MG tablet, Take 1 tablet (50 mg total) by mouth daily., Disp: 90 tablet, Rfl: 0   metroNIDAZOLE (FLAGYL) 500 MG tablet, Take 1 tablet (500 mg total) by mouth 2 (two) times daily., Disp: 14 tablet, Rfl: 0   naproxen (NAPROSYN) 500 MG tablet,  Take 1 tablet (500 mg total) by mouth 2 (two) times daily with a meal., Disp: 30 tablet, Rfl: 0   tinidazole (TINDAMAX) 500 MG tablet, Take 2 tablets (1,000 mg total) by mouth daily with breakfast. (Patient not taking: Reported on 07/25/2020), Disp: 10 tablet, Rfl: 2 No current facility-administered medications for this visit.  Facility-Administered Medications Ordered in Other Visits:    lidocaine (PF) (XYLOCAINE) 1 % injection, , , Anesthesia Intra-op, Karie Schwalbe, MD, 11 mL at 01/30/19 2218  Observations/Objective: Patient is well-developed, well-nourished in no acute distress.  Resting comfortably  at home.  Head is normocephalic, atraumatic.  No labored breathing.  Speech is clear and coherent with logical content.  Patient is alert and oriented at baseline.  Mild left gum swelling   Assessment and Plan: 1. Dental abscess (Primary) - amoxicillin-clavulanate (AUGMENTIN) 875-125 MG tablet; Take 1 tablet by mouth 2 (two) times daily.  Dispense: 14 tablet; Refill: 0  Force fluids Tylenol as needed Start Augmentin  Follow up as needed if symptoms worsen or do not improve   Follow Up Instructions: I discussed the assessment and treatment plan with the patient. The patient was provided an opportunity to ask questions and all were answered. The patient agreed with the plan and demonstrated an understanding of the instructions.  A copy of instructions were sent to the patient via MyChart unless otherwise noted below.     The patient was advised to call back or seek an in-person evaluation if the symptoms worsen or if the condition fails to improve as anticipated.    Jannifer Rodney, FNP

## 2024-03-26 ENCOUNTER — Ambulatory Visit: Payer: Medicaid Other | Admitting: Family Medicine

## 2024-09-21 ENCOUNTER — Telehealth: Admitting: Physician Assistant

## 2024-09-21 DIAGNOSIS — K047 Periapical abscess without sinus: Secondary | ICD-10-CM

## 2024-09-21 DIAGNOSIS — K0889 Other specified disorders of teeth and supporting structures: Secondary | ICD-10-CM | POA: Diagnosis not present

## 2024-09-21 MED ORDER — IBUPROFEN 800 MG PO TABS: 800.0000 mg | ORAL_TABLET | Freq: Three times a day (TID) | ORAL | 0 refills | Status: AC | PRN

## 2024-09-21 MED ORDER — AMOXICILLIN-POT CLAVULANATE 875-125 MG PO TABS: 1.0000 | ORAL_TABLET | Freq: Two times a day (BID) | ORAL | 0 refills | Status: AC

## 2024-09-21 NOTE — Progress Notes (Signed)
 Virtual Visit Consent   Debbie Wood, you are scheduled for a virtual visit with a [facility] provider today. Just as with appointments in the office, your consent must be obtained to participate. Your consent will be active for this visit and any virtual visit you may have with one of our providers in the next 365 days. If you have a MyChart account, a copy of this consent can be sent to you electronically.  As this is a virtual visit, video technology does not allow for your provider to perform a traditional examination. This may limit your provider's ability to fully assess your condition. If your provider identifies any concerns that need to be evaluated in person or the need to arrange testing (such as labs, EKG, etc.), we will make arrangements to do so. Although advances in technology are sophisticated, we cannot ensure that it will always work on either your end or our end. If the connection with a video visit is poor, the visit may have to be switched to a telephone visit. With either a video or telephone visit, we are not always able to ensure that we have a secure connection.  By engaging in this virtual visit, you consent to the provision of healthcare and authorize for your insurance to be billed (if applicable) for the services provided during this visit. Depending on your insurance coverage, you may receive a charge related to this service.  I need to obtain your verbal consent now. Are you willing to proceed with your visit today? Debbie Wood has provided verbal consent on 09/21/2024 for a virtual visit (video or telephone). Debbie Liszewski, PA-C  Date: 09/21/2024 11:44 AM   Virtual Visit via Video Note   I, Debbie Wood, connected with  Debbie Wood  ([id number], 1991-10-13) on 09/21/24 at 11:30 AM EDT by a video-enabled telemedicine application and verified that I am speaking with the correct person using two identifiers.  Location: Patient: Virtual Visit  Location Patient: Home Provider: Virtual Visit Location Provider: Home Office   I discussed the limitations of evaluation and management by telemedicine and the availability of in person appointments. The patient expressed understanding and agreed to proceed.    History of Present Illness: Debbie Wood is a 33 y.o. who identifies as a female who was assigned female at birth, and is being seen today for dental pain.  HPI: Reports dental pain since yesterday, with swelling onset overnight.  Patient states that she has a broken tooth on the left upper side.  She denies any recent trauma or injury.  She does not have a dentist at this time, is looking to make an appointment soon.  She has been taking ibuprofen  80 mg without significant relief.  She denies any fevers, chills, neck pain, swelling, difficulty swallowing, numbness or tingling, locked jaw.     Problems:  Patient Active Problem List   Diagnosis Date Noted   Panic disorder 12/08/2019   Preeclampsia 01/30/2019   Supervision of other normal pregnancy, antepartum 11/01/18   History of neonatal death 2018-10-07   Encounter for supervision of other normal pregnancy in first trimester 12/23/2015   Heart murmur     Allergies: No Known Allergies Medications:  Current Outpatient Medications:    amoxicillin -clavulanate (AUGMENTIN ) 875-125 MG tablet, Take 1 tablet by mouth 2 (two) times daily for 10 days., Disp: 20 tablet, Rfl: 0   ibuprofen  (ADVIL ) 800 MG tablet, Take 1 tablet (800 mg total) by mouth every 8 (eight) hours as  needed for up to 5 days for moderate pain (pain score 4-6)., Disp: 15 tablet, Rfl: 0 No current facility-administered medications for this visit.  Facility-Administered Medications Ordered in Other Visits:    lidocaine  (PF) (XYLOCAINE ) 1 % injection, , , Anesthesia Intra-op, Debbie Shuck, MD, 11 mL at 01/30/19 2218  Observations/Objective: Patient is well-developed, well-nourished in no acute distress.   Resting comfortably at home.  Head is normocephalic, atraumatic.  No labored breathing.  Mouth: #12?, missing tooth, gum, red swollen, no active drainage noted, overlying swelling on the left cheek Speech is clear and coherent with logical content.  Patient is alert and oriented at baseline.    Assessment and Plan: 1. Pain, dental (Primary) - ibuprofen  (ADVIL ) 800 MG tablet; Take 1 tablet (800 mg total) by mouth every 8 (eight) hours as needed for up to 5 days for moderate pain (pain score 4-6).  Dispense: 15 tablet; Refill: 0  2. Dental abscess - amoxicillin -clavulanate (AUGMENTIN ) 875-125 MG tablet; Take 1 tablet by mouth 2 (two) times daily for 10 days.  Dispense: 20 tablet; Refill: 0 - ibuprofen  (ADVIL ) 800 MG tablet; Take 1 tablet (800 mg total) by mouth every 8 (eight) hours as needed for up to 5 days for moderate pain (pain score 4-6).  Dispense: 15 tablet; Refill: 0  Please to the emergency room if any new or worsening symptoms  Please seek an in-person evaluation if the symptoms worsen or if the condition fails to improve as anticipated.  PCP follow-up in 5-7 days, if unable to been seen by dentist   Follow Up Instructions: I discussed the assessment and treatment plan with the patient. The patient was provided an opportunity to ask questions and all were answered. The patient agreed with the plan and demonstrated an understanding of the instructions.  A copy of instructions were sent to the patient via MyChart unless otherwise noted below.   The patient was advised to call back or seek an in-person evaluation if the symptoms worsen or if the condition fails to improve as anticipated.    Debbie Vandegrift, PA-C

## 2024-09-21 NOTE — Patient Instructions (Addendum)
  Debbie Wood, thank you for joining Debbie Blust, PA-C for today's virtual visit.  While this provider is not your primary care provider (PCP), if your PCP is located in our provider database this encounter information will be shared with them immediately following your visit.   A [facility] MyChart account gives you access to today's visit and all your visits, tests, and labs performed at [facility]  click here if you don't have a [facility] MyChart account or go to [email or web address]  Consent: (Patient) Debbie Wood provided verbal consent for this virtual visit at the beginning of the encounter.  Current Medications:  Current Outpatient Medications:    amoxicillin -clavulanate (AUGMENTIN ) 875-125 MG tablet, Take 1 tablet by mouth 2 (two) times daily., Disp: 14 tablet, Rfl: 0   chlorhexidine  (PERIDEX ) 0.12 % solution, Rinse mouth with 15mL for 30 seconds twice daily., Disp: 473 mL, Rfl: 0   ferrous sulfate  325 (65 FE) MG tablet, Take 1 tablet (325 mg total) by mouth 2 (two) times daily with a meal. (Patient not taking: Reported on 07/25/2020), Disp: 60 tablet, Rfl: 5   ibuprofen  (ADVIL ,MOTRIN ) 600 MG tablet, Take 1 tablet (600 mg total) by mouth every 6 (six) hours. (Patient not taking: Reported on 02/06/2019), Disp: 60 tablet, Rfl: 0   losartan  (COZAAR ) 50 MG tablet, Take 1 tablet (50 mg total) by mouth daily., Disp: 90 tablet, Rfl: 0   metroNIDAZOLE  (FLAGYL ) 500 MG tablet, Take 1 tablet (500 mg total) by mouth 2 (two) times daily., Disp: 14 tablet, Rfl: 0   naproxen  (NAPROSYN ) 500 MG tablet, Take 1 tablet (500 mg total) by mouth 2 (two) times daily with a meal., Disp: 30 tablet, Rfl: 0   tinidazole  (TINDAMAX ) 500 MG tablet, Take 2 tablets (1,000 mg total) by mouth daily with breakfast. (Patient not taking: Reported on 07/25/2020), Disp: 10 tablet, Rfl: 2 No current facility-administered medications for this visit.  Facility-Administered Medications Ordered  in Other Visits:    lidocaine  (PF) (XYLOCAINE ) 1 % injection, , , Anesthesia Intra-op, Thelbert Shuck, MD, 11 mL at 01/30/19 2218   Medications ordered in this encounter:  No orders of the defined types were placed in this encounter.    *If you need refills on other medications prior to your next appointment, please contact your pharmacy*  Follow-Up: Call back or seek an in-person evaluation if the symptoms worsen or if the condition fails to improve as anticipated.  [facility] [phone]  Other Instructions Please to the emergency room if any new or worsening symptoms  Please seek an in-person evaluation if the symptoms worsen or if the condition fails to improve as anticipated.  PCP follow-up in 5-7 days, if unable to been seen by dentist   If you have been instructed to have an in-person evaluation today at a local Urgent Care facility, please use the link below. It will take you to a list of all of our available [facility], including address, phone number and hours of operation. Please do not delay care.  [facility]  If you or a family member do not have a primary care provider, use the link below to schedule a visit and establish care. When you choose a [facility] primary care physician or advanced practice provider, you gain a long-term partner in health. Find a Primary Care Provider  Learn more about [facility]'s in-office and virtual care options:  - Get Care Now
